# Patient Record
Sex: Male | Born: 1980
Health system: Southern US, Community
[De-identification: ages and names within clinical notes are randomized; demographics above are authoritative.]

## PROBLEM LIST (undated history)

## (undated) DIAGNOSIS — E785 Hyperlipidemia, unspecified: Secondary | ICD-10-CM

## (undated) DIAGNOSIS — B354 Tinea corporis: Secondary | ICD-10-CM

## (undated) DIAGNOSIS — I10 Essential (primary) hypertension: Secondary | ICD-10-CM

## (undated) DIAGNOSIS — R079 Chest pain, unspecified: Secondary | ICD-10-CM

## (undated) DIAGNOSIS — E1110 Type 2 diabetes mellitus with ketoacidosis without coma: Secondary | ICD-10-CM

## (undated) DIAGNOSIS — E139 Other specified diabetes mellitus without complications: Secondary | ICD-10-CM

## (undated) DIAGNOSIS — K529 Noninfective gastroenteritis and colitis, unspecified: Secondary | ICD-10-CM

## (undated) HISTORY — DX: Hyperlipidemia, unspecified: E78.5

## (undated) HISTORY — DX: Essential (primary) hypertension: I10

## (undated) HISTORY — DX: Noninfective gastroenteritis and colitis, unspecified: K52.9

## (undated) HISTORY — PX: KNEE SURGERY: SHX244

## (undated) HISTORY — DX: Chest pain, unspecified: R07.9

## (undated) HISTORY — DX: Other specified diabetes mellitus without complications: E13.9

## (undated) HISTORY — DX: Type 2 diabetes mellitus with ketoacidosis without coma: E11.10

## (undated) HISTORY — DX: Tinea corporis: B35.4

---
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
## 1999-04-16 ENCOUNTER — Encounter: Admission: RE | Admit: 1999-04-16 | Discharge: 1999-04-16 | Payer: Self-pay | Admitting: Family Medicine

## 2010-11-07 ENCOUNTER — Ambulatory Visit: Payer: Self-pay | Admitting: Internal Medicine

## 2013-06-18 DIAGNOSIS — E785 Hyperlipidemia, unspecified: Secondary | ICD-10-CM

## 2013-06-18 DIAGNOSIS — B354 Tinea corporis: Secondary | ICD-10-CM

## 2013-06-18 DIAGNOSIS — E119 Type 2 diabetes mellitus without complications: Secondary | ICD-10-CM | POA: Insufficient documentation

## 2013-06-18 DIAGNOSIS — I10 Essential (primary) hypertension: Secondary | ICD-10-CM

## 2013-06-18 DIAGNOSIS — E139 Other specified diabetes mellitus without complications: Secondary | ICD-10-CM

## 2013-06-18 HISTORY — DX: Essential (primary) hypertension: I10

## 2013-06-18 HISTORY — DX: Tinea corporis: B35.4

## 2013-06-18 HISTORY — DX: Other specified diabetes mellitus without complications: E13.9

## 2013-06-18 HISTORY — DX: Hyperlipidemia, unspecified: E78.5

## 2014-03-12 ENCOUNTER — Other Ambulatory Visit: Payer: Self-pay | Admitting: Pharmacotherapy

## 2015-10-31 DIAGNOSIS — K529 Noninfective gastroenteritis and colitis, unspecified: Secondary | ICD-10-CM | POA: Insufficient documentation

## 2015-10-31 DIAGNOSIS — E1110 Type 2 diabetes mellitus with ketoacidosis without coma: Secondary | ICD-10-CM | POA: Insufficient documentation

## 2015-10-31 HISTORY — DX: Type 2 diabetes mellitus with ketoacidosis without coma: E11.10

## 2015-10-31 HISTORY — DX: Noninfective gastroenteritis and colitis, unspecified: K52.9

## 2016-11-06 DIAGNOSIS — I10 Essential (primary) hypertension: Secondary | ICD-10-CM | POA: Diagnosis not present

## 2016-11-06 DIAGNOSIS — E1165 Type 2 diabetes mellitus with hyperglycemia: Secondary | ICD-10-CM | POA: Diagnosis not present

## 2016-11-06 DIAGNOSIS — Z4681 Encounter for fitting and adjustment of insulin pump: Secondary | ICD-10-CM | POA: Diagnosis not present

## 2016-12-17 DIAGNOSIS — E119 Type 2 diabetes mellitus without complications: Secondary | ICD-10-CM | POA: Diagnosis not present

## 2017-01-06 DIAGNOSIS — E784 Other hyperlipidemia: Secondary | ICD-10-CM | POA: Diagnosis not present

## 2017-01-06 DIAGNOSIS — I10 Essential (primary) hypertension: Secondary | ICD-10-CM | POA: Diagnosis not present

## 2017-01-06 DIAGNOSIS — M722 Plantar fascial fibromatosis: Secondary | ICD-10-CM | POA: Diagnosis not present

## 2017-01-06 DIAGNOSIS — E1165 Type 2 diabetes mellitus with hyperglycemia: Secondary | ICD-10-CM | POA: Diagnosis not present

## 2017-02-20 DIAGNOSIS — Z6839 Body mass index (BMI) 39.0-39.9, adult: Secondary | ICD-10-CM | POA: Diagnosis not present

## 2017-02-20 DIAGNOSIS — Z4681 Encounter for fitting and adjustment of insulin pump: Secondary | ICD-10-CM | POA: Diagnosis not present

## 2017-02-20 DIAGNOSIS — I10 Essential (primary) hypertension: Secondary | ICD-10-CM | POA: Diagnosis not present

## 2017-02-20 DIAGNOSIS — E1165 Type 2 diabetes mellitus with hyperglycemia: Secondary | ICD-10-CM | POA: Diagnosis not present

## 2017-03-18 DIAGNOSIS — E119 Type 2 diabetes mellitus without complications: Secondary | ICD-10-CM | POA: Diagnosis not present

## 2017-03-21 DIAGNOSIS — Z23 Encounter for immunization: Secondary | ICD-10-CM | POA: Diagnosis not present

## 2017-03-21 DIAGNOSIS — R590 Localized enlarged lymph nodes: Secondary | ICD-10-CM | POA: Diagnosis not present

## 2017-03-21 DIAGNOSIS — Z0001 Encounter for general adult medical examination with abnormal findings: Secondary | ICD-10-CM | POA: Diagnosis not present

## 2017-03-21 DIAGNOSIS — Z1389 Encounter for screening for other disorder: Secondary | ICD-10-CM | POA: Diagnosis not present

## 2017-03-21 DIAGNOSIS — Z6838 Body mass index (BMI) 38.0-38.9, adult: Secondary | ICD-10-CM | POA: Diagnosis not present

## 2017-03-21 DIAGNOSIS — E119 Type 2 diabetes mellitus without complications: Secondary | ICD-10-CM | POA: Diagnosis not present

## 2017-03-26 DIAGNOSIS — I10 Essential (primary) hypertension: Secondary | ICD-10-CM | POA: Diagnosis not present

## 2017-04-28 ENCOUNTER — Other Ambulatory Visit: Payer: Self-pay | Admitting: Family Medicine

## 2017-05-13 DIAGNOSIS — E119 Type 2 diabetes mellitus without complications: Secondary | ICD-10-CM | POA: Diagnosis not present

## 2017-05-13 DIAGNOSIS — Z1389 Encounter for screening for other disorder: Secondary | ICD-10-CM | POA: Diagnosis not present

## 2017-05-13 DIAGNOSIS — E784 Other hyperlipidemia: Secondary | ICD-10-CM | POA: Diagnosis not present

## 2017-05-13 DIAGNOSIS — I10 Essential (primary) hypertension: Secondary | ICD-10-CM | POA: Diagnosis not present

## 2017-05-13 DIAGNOSIS — M722 Plantar fascial fibromatosis: Secondary | ICD-10-CM | POA: Diagnosis not present

## 2017-05-23 ENCOUNTER — Other Ambulatory Visit: Payer: Self-pay | Admitting: Family Medicine

## 2017-05-23 DIAGNOSIS — R1909 Other intra-abdominal and pelvic swelling, mass and lump: Secondary | ICD-10-CM

## 2017-05-28 ENCOUNTER — Ambulatory Visit
Admission: RE | Admit: 2017-05-28 | Discharge: 2017-05-28 | Disposition: A | Discharge status: Home / Self Care | Payer: BLUE CROSS/BLUE SHIELD | Source: Ambulatory Visit | Attending: Family Medicine | Admitting: Family Medicine

## 2017-05-28 ENCOUNTER — Other Ambulatory Visit: Payer: Self-pay

## 2017-05-28 DIAGNOSIS — R1909 Other intra-abdominal and pelvic swelling, mass and lump: Secondary | ICD-10-CM | POA: Diagnosis not present

## 2017-05-28 IMAGING — US US PELVIS LIMITED
1 series · 14 of 17 positions shown · non-contrast
Comparison: None.

CLINICAL DATA: Right inguinal mass for 2 months.  No known injury.

EXAM:
LIMITED ULTRASOUND OF PELVIS
TECHNIQUE: Limited transabdominal ultrasound examination of the pelvis was
performed.

[Series 1: us pelvis limited · 0.05mm/px · 17 acquisitions, 14 frames shown]
[im 1/17]
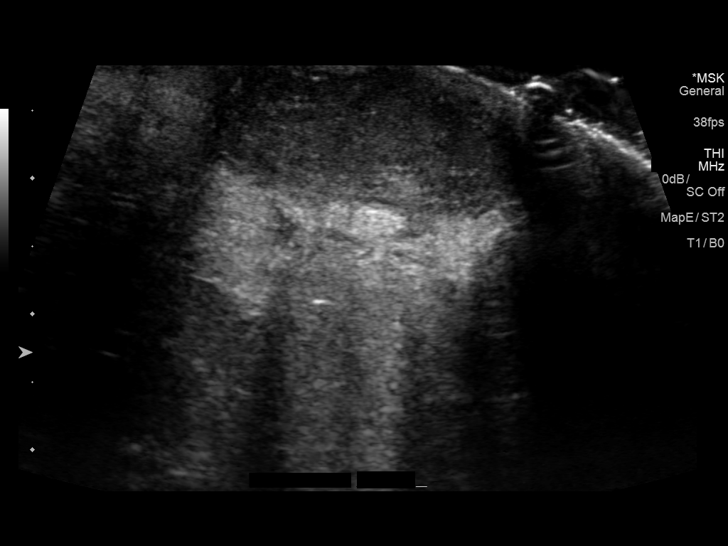
[im 2/17]
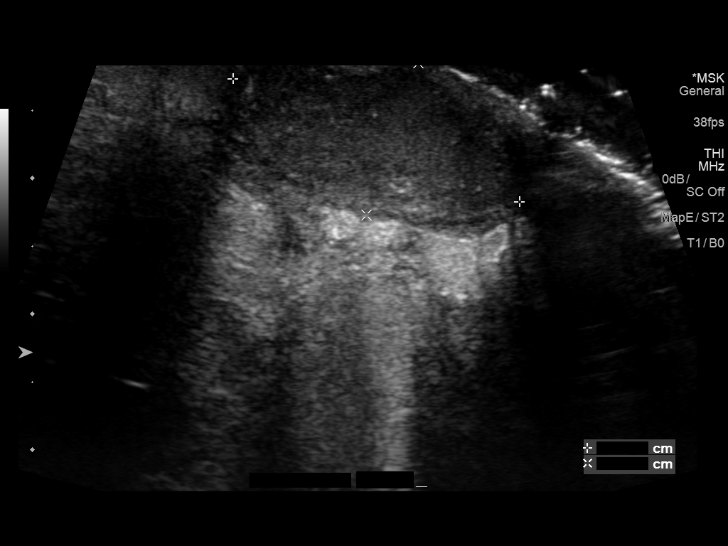
[im 4/17]
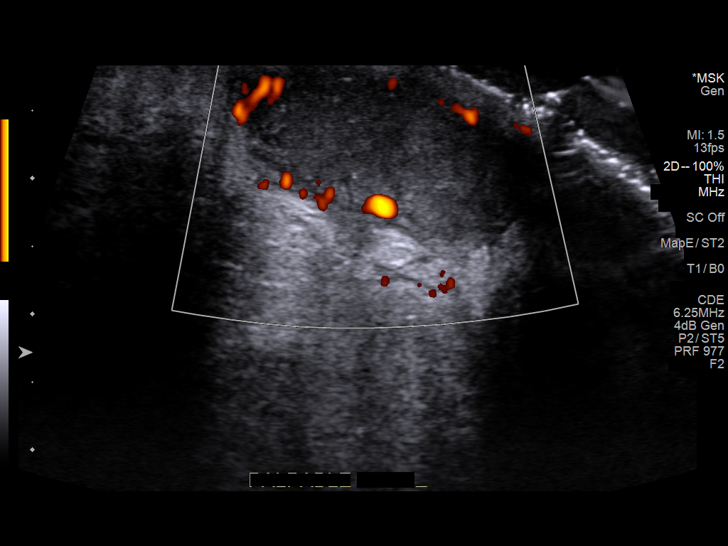
[im 5/17]
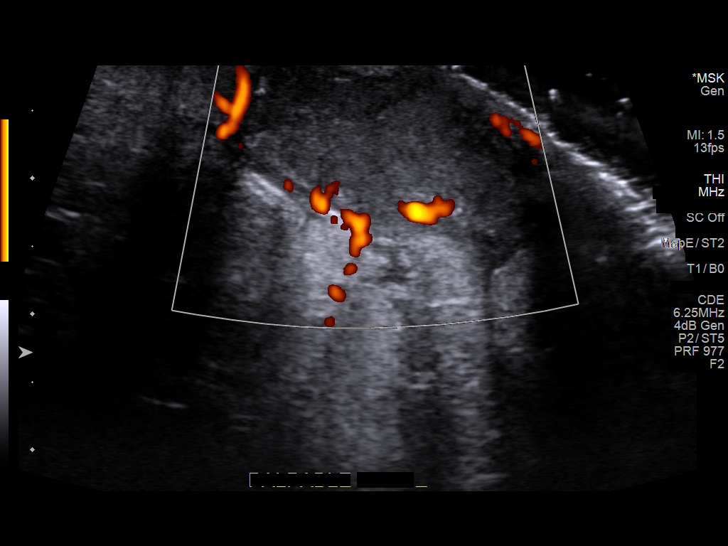
[im 6/17]
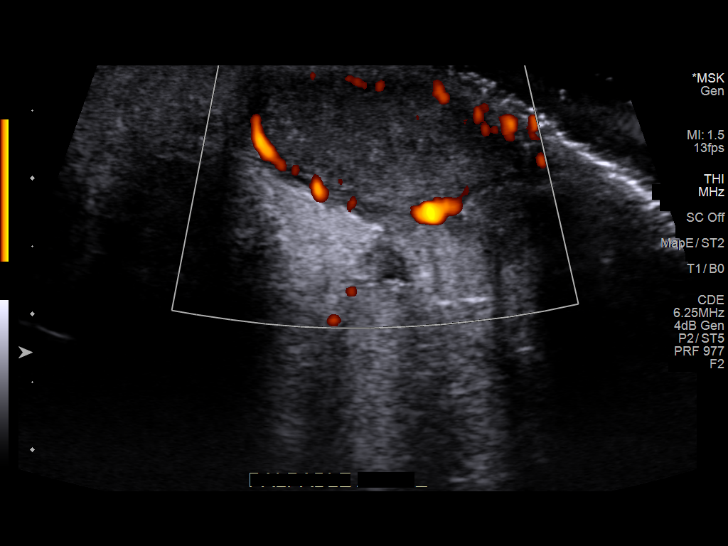
[im 7/17]
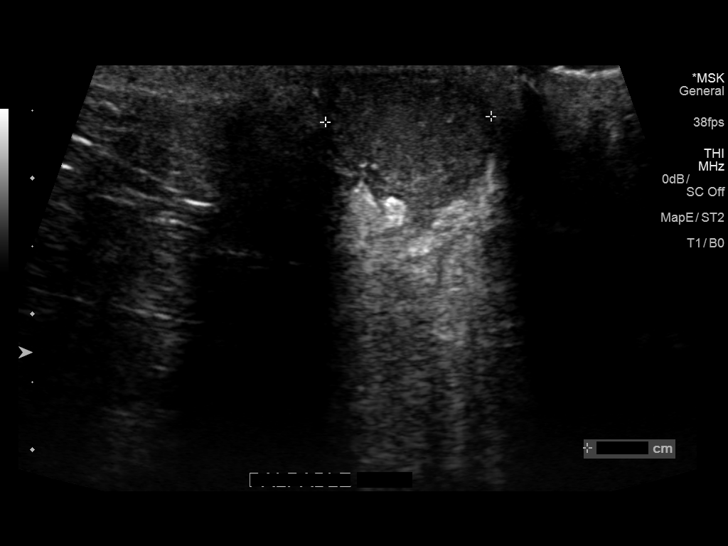
[im 8/17]
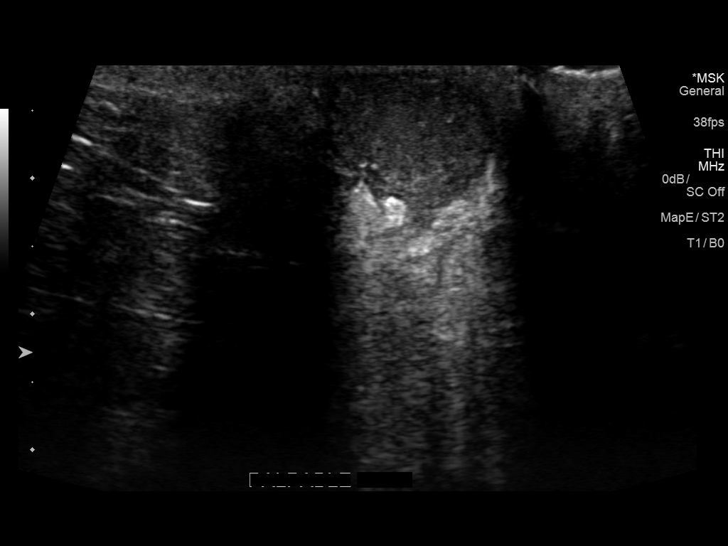
[im 10/17]
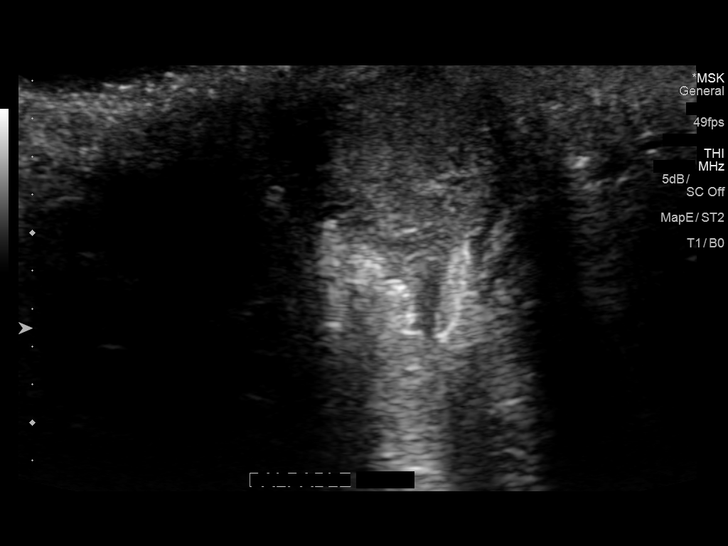
[im 11/17]
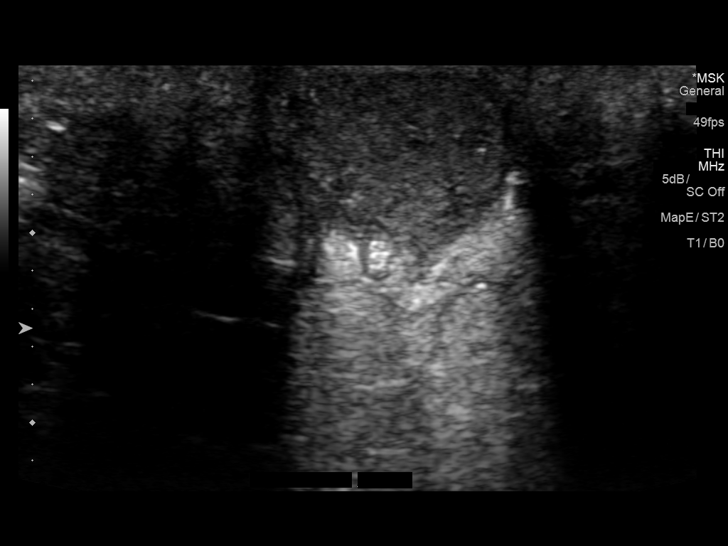
[im 12/17]
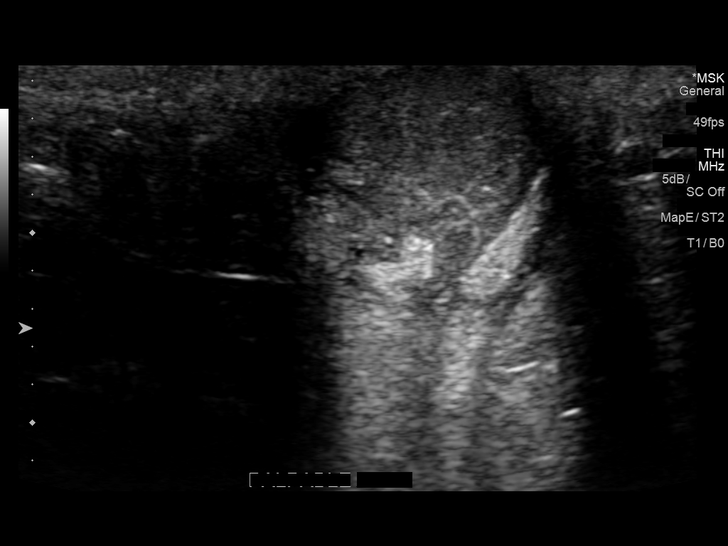
[im 13/17]
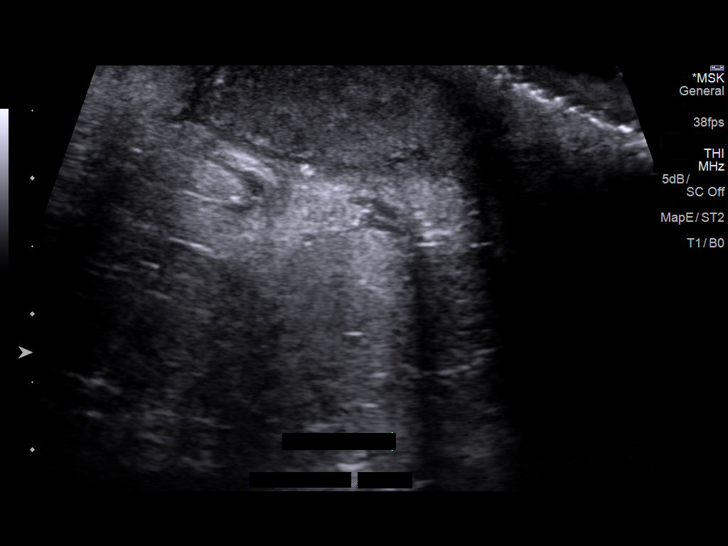
[im 14/17]
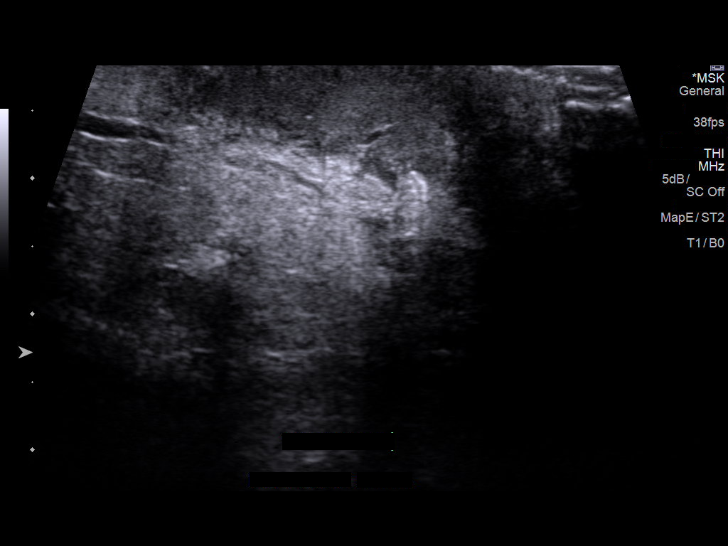
[im 16/17]
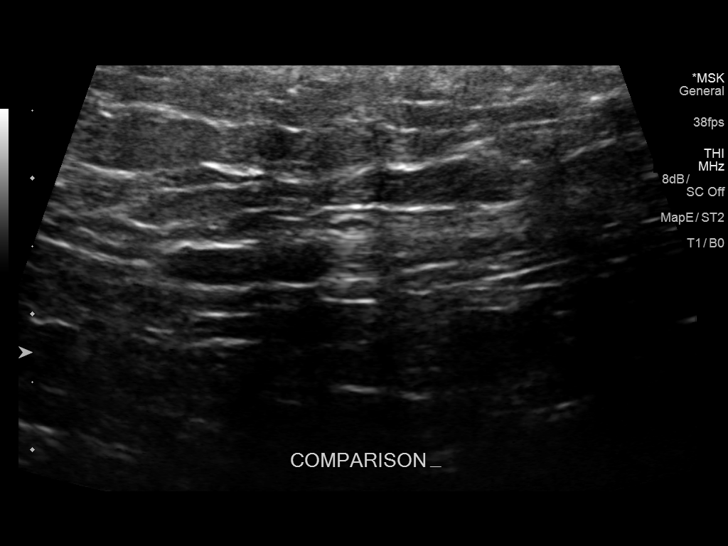
[im 17/17]
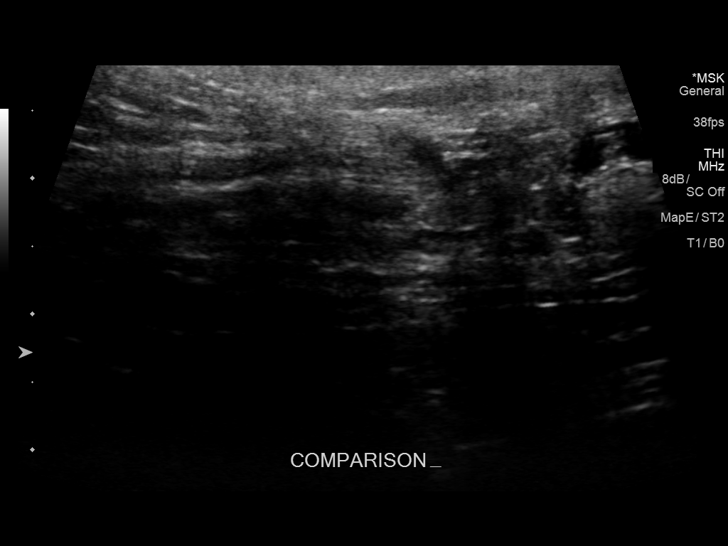

[14 of 17 positions shown; findings below may reference images not displayed]

FINDINGS: Scanning was directed toward the region of concern. A lesion in the
subcutaneous tissues measuring 2.3 x 1.2 x 1.2 cm is identified. The
lesion is hypoechoic with increase through transmission. Doppler
imaging demonstrates blood flow about the periphery of the image.
IMPRESSION: Nonspecific hypoechoic lesion in the region of concern may be and
myxoid or fibrous tumor. MRI with and without contrast is
recommended for further evaluation.

## 2017-06-02 ENCOUNTER — Other Ambulatory Visit: Payer: Self-pay | Admitting: Family Medicine

## 2017-06-02 DIAGNOSIS — R9389 Abnormal findings on diagnostic imaging of other specified body structures: Secondary | ICD-10-CM

## 2017-06-16 ENCOUNTER — Ambulatory Visit
Admission: RE | Admit: 2017-06-16 | Discharge: 2017-06-16 | Disposition: A | Discharge status: Home / Self Care | Payer: BLUE CROSS/BLUE SHIELD | Source: Ambulatory Visit | Attending: Family Medicine | Admitting: Family Medicine

## 2017-06-16 DIAGNOSIS — R9389 Abnormal findings on diagnostic imaging of other specified body structures: Secondary | ICD-10-CM

## 2017-06-16 DIAGNOSIS — K4090 Unilateral inguinal hernia, without obstruction or gangrene, not specified as recurrent: Secondary | ICD-10-CM | POA: Diagnosis not present

## 2017-06-16 IMAGING — MR MR PELVIS WO/W CM
11 series · 48 of 48 positions shown · IV contrast (Multihance 20 ML)
Comparison: Soft tissue ultrasound dated 03/28/2017

CLINICAL DATA: Palpable right groin abnormality, resolved 2 weeks
ago

EXAM:
MRI PELVIS WITHOUT AND WITH CONTRAST
TECHNIQUE: Multiplanar multisequence MR imaging of the pelvis was performed
both before and after administration of intravenous contrast.
CONTRAST:  20 mL Multihance IV
Creatinine was obtained on site at [HOSPITAL] at [HOSPITAL].
Results: Creatinine 0.8 mg/dL.

[Series 12: T2 · coronal · 5.0mm · 1.00mm/px · 4 of 36 slices shown (1 of 2)]
[im 1/36]
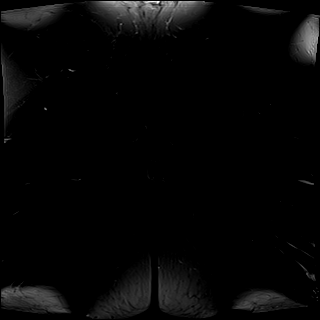
[im 12/36]
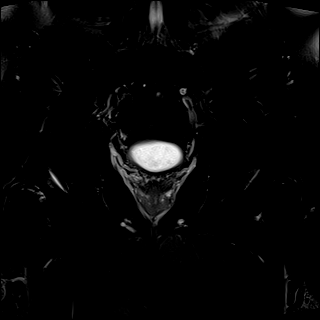
[im 24/36]
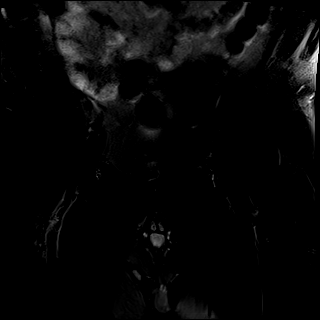
[im 36/36]
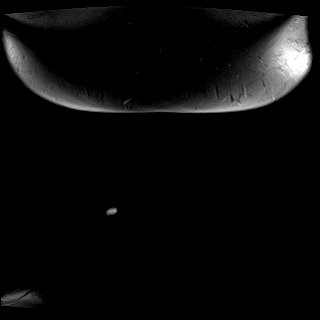

[Series 13: T2 · sagittal · 4.0mm · 1.00mm/px · 4 of 39 slices shown (2 of 2)]
[im 1/39]
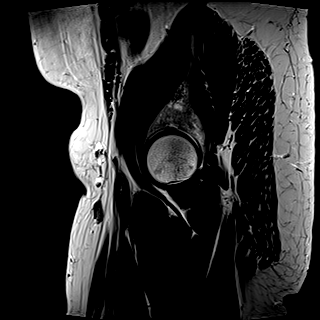
[im 13/39]
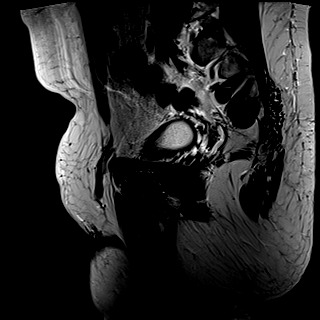
[im 26/39]
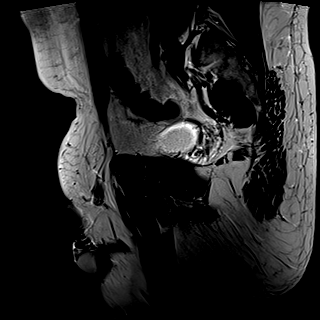
[im 39/39]
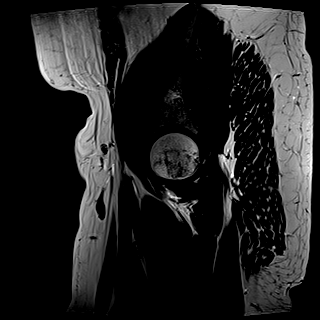

[Series 14: T2 fat-sat · coronal · 5.0mm · 1.00mm/px · 4 of 36 slices shown (1 of 2)]
[im 1/36]
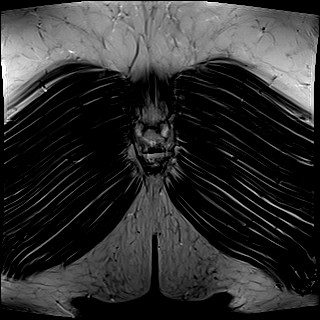
[im 12/36]
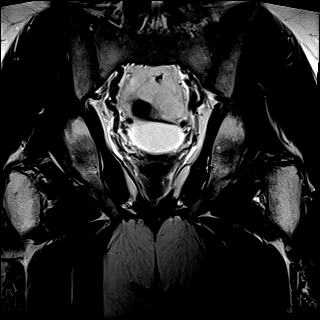
[im 24/36]
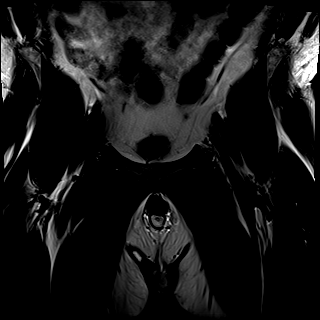
[im 36/36]
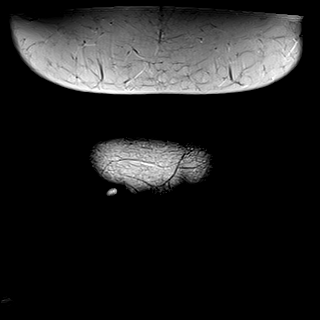

[Series 15: T2 fat-sat · axial · 4.0mm · 0.94mm/px · z∈[-88,+105]mm · 5 of 44 slices shown (2 of 2)]
[im 1/44]
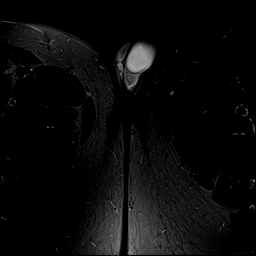
[im 11/44]
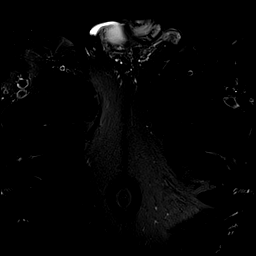
[im 22/44]
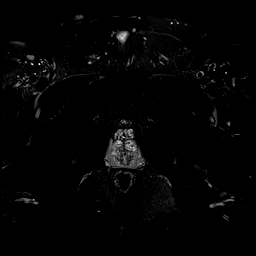
[im 33/44]
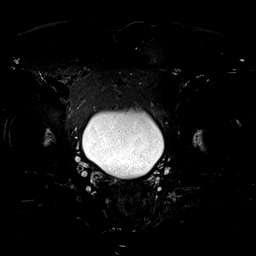
[im 44/44]
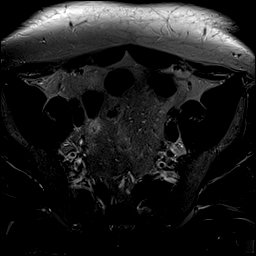

[Series 16: T1 · axial · 4.0mm · 0.94mm/px · z∈[-88,+105]mm · 5 of 44 slices shown (1 of 3)]
[im 1/44]
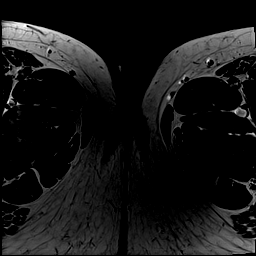
[im 11/44]
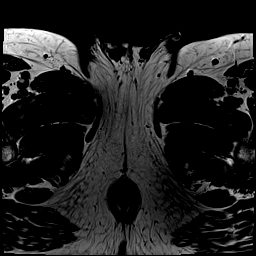
[im 22/44]
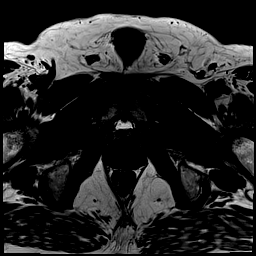
[im 33/44]
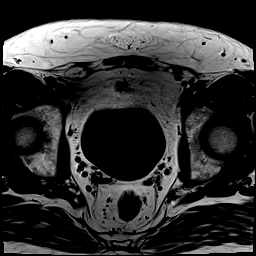
[im 44/44]
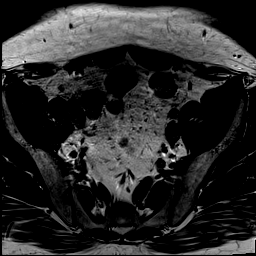

[Series 17: T1 fat-sat · axial · non-contrast · 4.0mm · 0.94mm/px · z∈[-88,+105]mm · 5 of 44 slices shown (1 of 4)]
[im 1/44]
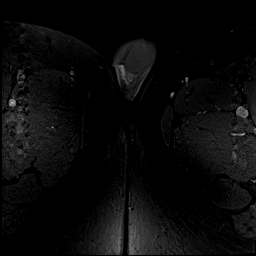
[im 11/44]
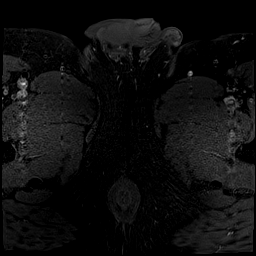
[im 22/44]
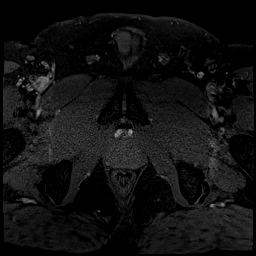
[im 33/44]
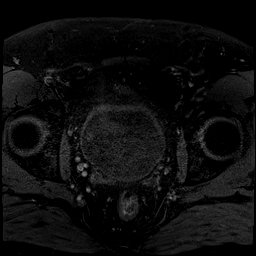
[im 44/44]
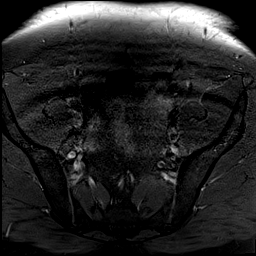

[Series 19: T1 · coronal · 5.0mm · 1.00mm/px · 4 of 36 slices shown (2 of 3)]
[im 1/36]
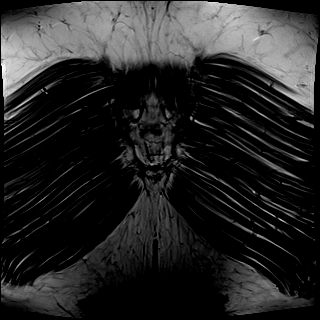
[im 12/36]
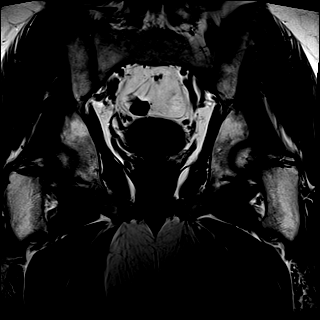
[im 24/36]
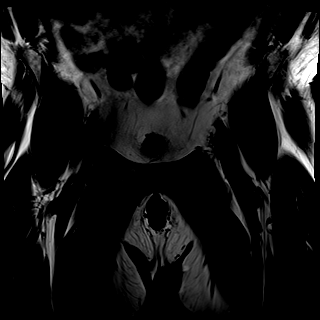
[im 36/36]
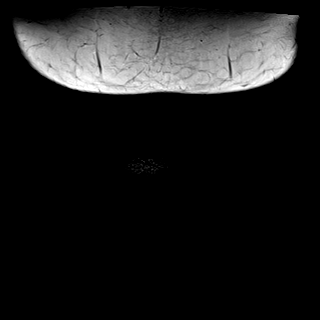

[Series 20: T1 · sagittal · 4.0mm · 1.00mm/px · 4 of 39 slices shown (3 of 3)]
[im 1/39]
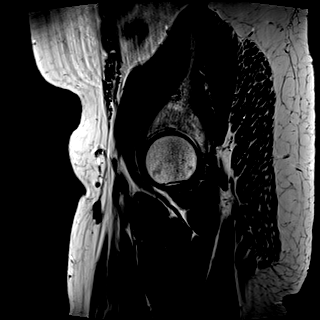
[im 13/39]
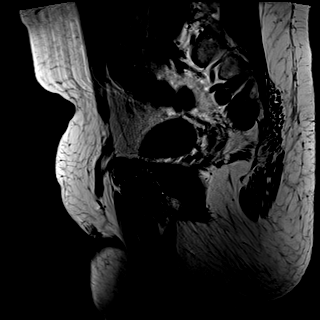
[im 26/39]
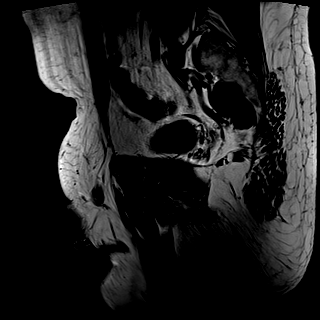
[im 39/39]
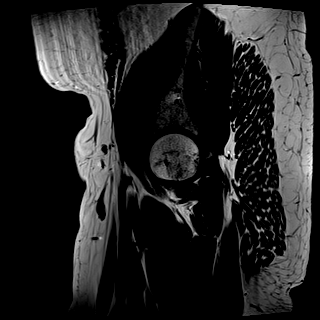

[Series 21: T1 fat-sat · axial · non-contrast · 4.0mm · 0.94mm/px · z∈[-88,+105]mm · 5 of 44 slices shown (2 of 4)]
[im 1/44]
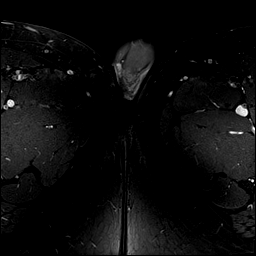
[im 11/44]
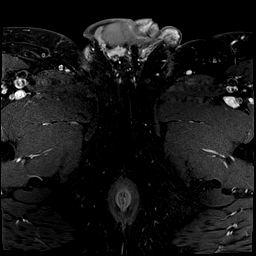
[im 22/44]
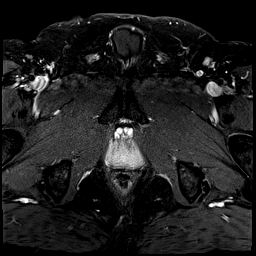
[im 33/44]
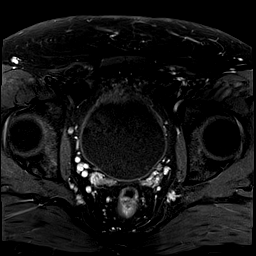
[im 44/44]
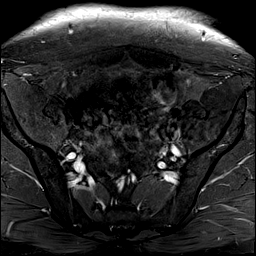

[Series 22: T1 fat-sat · coronal · 5.0mm · 1.00mm/px · 4 of 36 slices shown (3 of 4)]
[im 1/36]
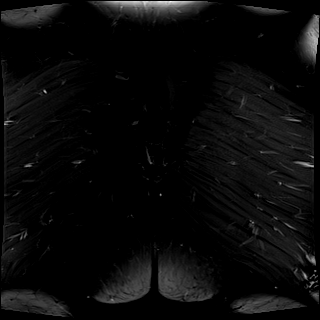
[im 12/36]
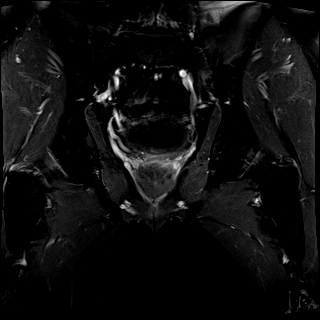
[im 24/36]
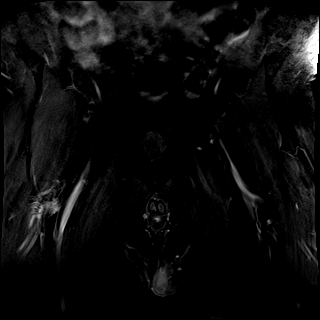
[im 36/36]
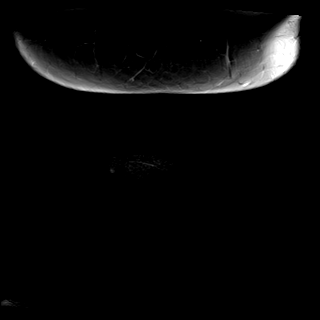

[Series 23: T1 fat-sat · sagittal · 4.0mm · 1.00mm/px · 4 of 39 slices shown (4 of 4)]
[im 1/39]
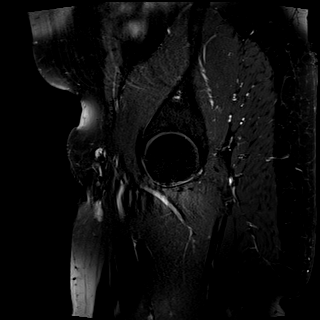
[im 13/39]
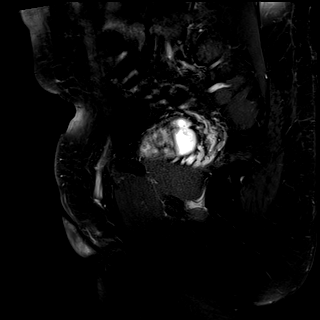
[im 26/39]
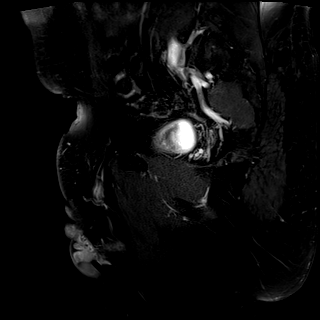
[im 39/39]
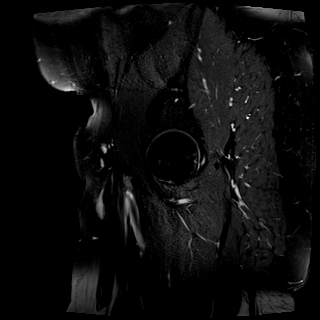

[48 of 48 positions shown; findings below may reference images not displayed]

FINDINGS: Urinary Tract:  Bladder is within normal limits.

Bowel:  Visualized bowel is unremarkable.

Vascular/Lymphatic: No suspicious pelvic lymphadenopathy.

Small bilateral inguinal nodes measuring up to 9 mm short axis,
within normal limits.

Reproductive:  Prostate is within normal limits.

Other: No soft tissue mass, fluid collection, or abscess in the
right groin/inguinal region to correspond to the palpable
abnormality which has clinically resolved.

Musculoskeletal: Tiny fat containing right inguinal hernia (series
20/ image 12).

No focal osseous lesions.
IMPRESSION: No soft tissue mass, fluid collection, or abscess in the right
groin/inguinal region.

Tiny fat containing right inguinal hernia.

Small bilateral inguinal nodes, reactive.

## 2017-06-25 DIAGNOSIS — Z4681 Encounter for fitting and adjustment of insulin pump: Secondary | ICD-10-CM | POA: Diagnosis not present

## 2017-06-25 DIAGNOSIS — I10 Essential (primary) hypertension: Secondary | ICD-10-CM | POA: Diagnosis not present

## 2017-06-25 DIAGNOSIS — Z6837 Body mass index (BMI) 37.0-37.9, adult: Secondary | ICD-10-CM | POA: Diagnosis not present

## 2017-06-25 DIAGNOSIS — E1165 Type 2 diabetes mellitus with hyperglycemia: Secondary | ICD-10-CM | POA: Diagnosis not present

## 2017-08-04 DIAGNOSIS — E119 Type 2 diabetes mellitus without complications: Secondary | ICD-10-CM | POA: Diagnosis not present

## 2017-09-10 DIAGNOSIS — I10 Essential (primary) hypertension: Secondary | ICD-10-CM | POA: Diagnosis not present

## 2017-09-10 DIAGNOSIS — E119 Type 2 diabetes mellitus without complications: Secondary | ICD-10-CM | POA: Diagnosis not present

## 2017-09-10 DIAGNOSIS — Z23 Encounter for immunization: Secondary | ICD-10-CM | POA: Diagnosis not present

## 2017-10-13 DIAGNOSIS — J0140 Acute pansinusitis, unspecified: Secondary | ICD-10-CM | POA: Diagnosis not present

## 2017-10-22 DIAGNOSIS — I10 Essential (primary) hypertension: Secondary | ICD-10-CM | POA: Diagnosis not present

## 2017-10-22 DIAGNOSIS — Z794 Long term (current) use of insulin: Secondary | ICD-10-CM | POA: Diagnosis not present

## 2017-10-22 DIAGNOSIS — Z4681 Encounter for fitting and adjustment of insulin pump: Secondary | ICD-10-CM | POA: Diagnosis not present

## 2017-10-22 DIAGNOSIS — E1165 Type 2 diabetes mellitus with hyperglycemia: Secondary | ICD-10-CM | POA: Diagnosis not present

## 2017-10-30 DIAGNOSIS — E119 Type 2 diabetes mellitus without complications: Secondary | ICD-10-CM | POA: Diagnosis not present

## 2017-12-23 DIAGNOSIS — J101 Influenza due to other identified influenza virus with other respiratory manifestations: Secondary | ICD-10-CM | POA: Diagnosis not present

## 2018-01-21 DIAGNOSIS — R0789 Other chest pain: Secondary | ICD-10-CM | POA: Diagnosis not present

## 2018-01-28 DIAGNOSIS — K219 Gastro-esophageal reflux disease without esophagitis: Secondary | ICD-10-CM | POA: Diagnosis not present

## 2018-01-28 DIAGNOSIS — Z6837 Body mass index (BMI) 37.0-37.9, adult: Secondary | ICD-10-CM | POA: Diagnosis not present

## 2018-01-28 DIAGNOSIS — Z1389 Encounter for screening for other disorder: Secondary | ICD-10-CM | POA: Diagnosis not present

## 2018-01-28 DIAGNOSIS — R0789 Other chest pain: Secondary | ICD-10-CM | POA: Diagnosis not present

## 2018-01-28 DIAGNOSIS — R140 Abdominal distension (gaseous): Secondary | ICD-10-CM | POA: Diagnosis not present

## 2018-02-02 ENCOUNTER — Telehealth: Payer: Self-pay

## 2018-02-02 NOTE — Telephone Encounter (Signed)
Sent referral to scheduling, attached notes to March file.   

## 2018-02-04 DIAGNOSIS — R079 Chest pain, unspecified: Secondary | ICD-10-CM | POA: Insufficient documentation

## 2018-02-04 HISTORY — DX: Chest pain, unspecified: R07.9

## 2018-02-09 ENCOUNTER — Telehealth: Payer: Self-pay

## 2018-02-09 NOTE — Telephone Encounter (Signed)
FAXED NOTES TO [facility] 

## 2018-02-09 NOTE — Progress Notes (Signed)
Cardiology Office Note:    Date:  02/10/2018   ID:  Keith Bautista, DOB 10-13-1981, MRN [id number]  PCP:  Assunta Found, MD  Cardiologist:  Norman Herrlich, MD   Referring MD: Assunta Found, MD  ASSESSMENT:    1. Chest pain, unspecified type   2. Hyperlipidemia, unspecified hyperlipidemia type   3. Essential hypertension   4. Type 2 diabetes mellitus without complication, with long-term current use of insulin (HCC)    PLAN:    In order of problems listed above:  1. His chest pain is atypical but concerning fortunately his EKG remains normal.  I will draw a troponin today if abnormal he will need to be evaluated as an inpatient acute coronary syndrome if normal cardiac CTA will be performed.  He will continue his statin and I asked him to take a low-dose aspirin.  If his cardiac evaluation is normal he would benefit from endoscopy with the severity of his symptoms. 2. Stable continue statin await labs from his PCP 3. Stable blood pressure target continue current treatment 4. Stable managed by endocrinology  Next appointment   Medication Adjustments/Labs and Tests Ordered: Current medicines are reviewed at length with the patient today.  Concerns regarding medicines are outlined above.  Orders Placed This Encounter  Procedures  . CT CORONARY MORPH W/CTA COR W/SCORE W/CA W/CM &/OR WO/CM  . CT CORONARY FRACTIONAL FLOW RESERVE DATA PREP  . CT CORONARY FRACTIONAL FLOW RESERVE FLUID ANALYSIS  . Troponin I  . EKG 12-Lead   Meds ordered this encounter  Medications  . metoprolol tartrate (LOPRESSOR) 50 MG tablet    Sig: Take 1 tablet (50 mg total) by mouth once for 1 dose. Take 1 tablet 1 hour before cardiac CTA.    Dispense:  1 tablet    Refill:  0     Chief Complaint  Patient presents with  . New Patient (Initial Visit)    to evaluate CP x 1 month     History of Present Illness:    Keith Bautista is a 37 y.o. male with T2 DM hypertension and hyperlipidemia who is  being seen today for the evaluation of chest pain at the request of Assunta Found, MD. He developed diabetes about age 48 and uses an insulin pump.  For the last 4-6 weeks he has been aware of discomfort in his chest it is for all purposes constant is relatively mild it is always there and it causes him to become apprehensive he is pushed himself physically it does not change the symptoms and when he rests he does not have relief.  He also complains bitterly of GI upset with a sensation of fullness belching that gives him transient relief but no dysphagia and ongoing diarrhea.  He has no known history of heart disease.  Chest discomfort is not severe and is not pleuritic sign associated with shortness of breath unrelated to his meals.  Past Medical History:  Diagnosis Date  . Chest pain 02/04/2018  . 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 DKA (diabetic ketoacidoses) (HCC) 10/31/2015  . Gastroenteritis, acute 10/31/2015  . Hyperlipidemia 06/18/2013  . Hypertension 06/18/2013  . Tinea corporis 06/18/2013    Past Surgical History:  Procedure Laterality Date  . KNEE SURGERY Right     Current Medications: Current Meds  Medication Sig  . atorvastatin (LIPITOR) 20 MG tablet Take 20 mg by mouth daily.  . dapagliflozin propanediol (FARXIGA) 10 MG TABS tablet TAKE 1 TABLET ONCE  DAILY  . insulin lispro (HUMALOG) 100 UNIT/ML injection Inject into the skin. Insulin pump/sliding scale  . losartan (COZAAR) 100 MG tablet Take 100 mg by mouth daily.  . pantoprazole (PROTONIX) 40 MG tablet Take 40 mg by mouth daily as needed.  . Simethicone (GAS-X PO) Take 125 mg by mouth daily.     Allergies:   Simvastatin   Social History   Socioeconomic History  . Marital status: Married    Spouse name: Not on file  . Number of children: Not on file  . Years of education: Not on file  . Highest education level: Not on file  Occupational History  . Not on file  Social Needs  . Financial resource  strain: Not on file  . Food insecurity:    Worry: Not on file    Inability: Not on file  . Transportation needs:    Medical: Not on file    Non-medical: Not on file  Tobacco Use  . Smoking status: Never Smoker  . Smokeless tobacco: Former Neurosurgeon    Types: Chew, Snuff  Substance and Sexual Activity  . Alcohol use: Not Currently  . Drug use: Never  . Sexual activity: Not on file  Lifestyle  . Physical activity:    Days per week: Not on file    Minutes per session: Not on file  . Stress: Not on file  Relationships  . Social connections:    Talks on phone: Not on file    Gets together: Not on file    Attends religious service: Not on file    Active member of club or organization: Not on file    Attends meetings of clubs or organizations: Not on file    Relationship status: Not on file  Other Topics Concern  . Not on file  Social History Narrative  . Not on file     Family History: The patient's family history includes Atrial fibrillation in his father; CAD in his maternal grandfather; Congestive Heart Failure in his mother; Heart attack in his maternal grandfather; Hypertension in his brother; Prostate cancer in his paternal grandfather; Rheum arthritis in his father.  ROS:   Review of Systems  Constitution: Negative.  HENT: Negative.   Eyes: Negative.   Cardiovascular: Positive for chest pain.  Respiratory: Negative.   Endocrine: Negative.   Hematologic/Lymphatic: Negative.   Skin: Negative.   Musculoskeletal: Negative.   Gastrointestinal: Positive for diarrhea and flatus (and belching).  Genitourinary: Negative.   Neurological: Negative.   Psychiatric/Behavioral: Negative.   Allergic/Immunologic: Negative.    Please see the history of present illness.     All other systems reviewed and are negative.  EKGs/Labs/Other Studies Reviewed:    The following studies were reviewed today:   EKG:  EKG is  ordered today.  The ekg ordered today demonstrates SRTH  normal  Recent Labs: No results found for requested labs within last 8760 hours.  Recent Lipid Panel No results found for: CHOL, TRIG, HDL, CHOLHDL, VLDL, LDLCALC, LDLDIRECT  Physical Exam:    VS:  BP 134/86 (BP Location: Left Arm, Patient Position: Sitting, Cuff Size: Normal)   Pulse 73   Ht 6\' 2"  (1.88 m)   Wt 283 lb 12.8 oz (128.7 kg)   SpO2 98%   BMI 36.44 kg/m     Wt Readings from Last 3 Encounters:  02/10/18 283 lb 12.8 oz (128.7 kg)     GEN:  Well nourished, well developed in no acute distress HEENT: Normal NECK:  No JVD; No carotid bruits LYMPHATICS: No lymphadenopathy CARDIAC: RRR, no murmurs, rubs, gallops RESPIRATORY:  Clear to auscultation without rales, wheezing or rhonchi  ABDOMEN: Soft, non-tender, non-distended MUSCULOSKELETAL:  No edema; No deformity  SKIN: Warm and dry NEUROLOGIC:  Alert and oriented x 3 PSYCHIATRIC:  Normal affect     Signed, Norman HerrlichBrian Munley, MD  02/10/2018 2:51 PM    [facility]

## 2018-02-10 ENCOUNTER — Encounter: Payer: Self-pay | Admitting: Cardiology

## 2018-02-10 ENCOUNTER — Ambulatory Visit (INDEPENDENT_AMBULATORY_CARE_PROVIDER_SITE_OTHER): Payer: BLUE CROSS/BLUE SHIELD | Admitting: Cardiology

## 2018-02-10 VITALS — BP 134/86 | HR 73 | Ht 74.0 in | Wt 283.8 lb

## 2018-02-10 DIAGNOSIS — E109 Type 1 diabetes mellitus without complications: Secondary | ICD-10-CM | POA: Diagnosis not present

## 2018-02-10 DIAGNOSIS — E785 Hyperlipidemia, unspecified: Secondary | ICD-10-CM | POA: Diagnosis not present

## 2018-02-10 DIAGNOSIS — I10 Essential (primary) hypertension: Secondary | ICD-10-CM

## 2018-02-10 DIAGNOSIS — R079 Chest pain, unspecified: Secondary | ICD-10-CM | POA: Diagnosis not present

## 2018-02-10 MED ORDER — METOPROLOL TARTRATE 50 MG PO TABS: 50.0000 mg | ORAL_TABLET | Freq: Once | ORAL | 0 refills | Status: DC

## 2018-02-10 NOTE — Patient Instructions (Addendum)
Medication Instructions:  Your physician has recommended you make the following change in your medication:  START enteric coated aspirin 81 mg daily  Labwork: Your physician recommends that you have the following labs drawn: Troponin.  Testing/Procedures: Your physician has requested that you have cardiac CT. Cardiac computed tomography (CT) is a painless test that uses an x-ray machine to take clear, detailed pictures of your heart. For further information please visit [email or web address]. Please follow instruction sheet as given.  Please arrive at the [facility] main entrance of [facility] at xx:xx AM (30-45 minutes prior to test start time)  [facility] [address] [place], [place] [address] [phone]  Proceed to the [facility] Radiology Department (First Floor).  Please follow these instructions carefully (unless otherwise directed):  Hold all erectile dysfunction medications at least 48 hours prior to test.  On the Night Before the Test: . Drink plenty of water. . Do not consume any caffeinated/decaffeinated beverages or chocolate 12 hours prior to your test. . Do not take any antihistamines 12 hours prior to your test.  On the Day of the Test: . Drink plenty of water. Do not drink any water within one hour of the test. . Do not eat any food 4 hours prior to the test. . You may take your regular medications prior to the test. . IF NOT ON A BETA BLOCKER - Take 50 mg of lopressor (metoprolol) one hour before the test. . HOLD Furosemide morning of the test.  After the Test: . Drink plenty of water. . After receiving IV contrast, you may experience a mild flushed feeling. This is normal. . On occasion, you may experience a mild rash up to 24 hours after the test. This is not dangerous. If this occurs, you can take Benadryl 25 mg and increase your fluid intake. . If you experience trouble breathing, this can be serious. If it is severe call 911  IMMEDIATELY. If it is mild, please call our office.  Follow-Up: Your physician recommends that you schedule a follow-up appointment in: 3 weeks.  Any Other Special Instructions Will Be Listed Below (If Applicable).     If you need a refill on your cardiac medications before your next appointment, please call your pharmacy.

## 2018-02-11 LAB — TROPONIN I: Troponin I: <0.01 ng/mL (ref 0.00–0.04)

## 2018-02-18 DIAGNOSIS — E1165 Type 2 diabetes mellitus with hyperglycemia: Secondary | ICD-10-CM | POA: Diagnosis not present

## 2018-02-18 DIAGNOSIS — I10 Essential (primary) hypertension: Secondary | ICD-10-CM | POA: Diagnosis not present

## 2018-02-18 DIAGNOSIS — Z794 Long term (current) use of insulin: Secondary | ICD-10-CM | POA: Diagnosis not present

## 2018-02-18 DIAGNOSIS — Z4681 Encounter for fitting and adjustment of insulin pump: Secondary | ICD-10-CM | POA: Diagnosis not present

## 2018-02-19 DIAGNOSIS — E119 Type 2 diabetes mellitus without complications: Secondary | ICD-10-CM | POA: Diagnosis not present

## 2018-02-24 ENCOUNTER — Telehealth: Payer: Self-pay | Admitting: Cardiology

## 2018-02-24 NOTE — Telephone Encounter (Signed)
Patient is calling back because he ha not heard about scheduling his Cardiac CT???

## 2018-02-24 NOTE — Telephone Encounter (Signed)
Patient advised that a message has been sent to the scheduler again to follow-up and that he should hear something in the next few days. Patient verbalized understanding. No further questions.

## 2018-03-02 ENCOUNTER — Telehealth: Payer: Self-pay | Admitting: Cardiology

## 2018-03-02 NOTE — Progress Notes (Deleted)
Cardiology Office Note:    Date:  03/02/2018   ID:  Keith Bautista, DOB 09-05-1981, MRN [id number]  PCP:  Assunta FoundGolding, John, MD  Cardiologist:  Norman HerrlichBrian Zailyn Thoennes, MD    Referring MD: Assunta FoundGolding, John, MD    ASSESSMENT:    No diagnosis found. PLAN:    In order of problems listed above:  1. ***   Next appointment: ***   Medication Adjustments/Labs and Tests Ordered: Current medicines are reviewed at length with the patient today.  Concerns regarding medicines are outlined above.  No orders of the defined types were placed in this encounter.  No orders of the defined types were placed in this encounter.   No chief complaint on file.   History of Present Illness:    Keith Bautista is a 37 y.o. male with a hx of T2 DM hypertension and hyperlipidemia  last seen 02/10/18 for evaluation of chest pain. Compliance with diet, lifestyle and medications: *** Past Medical History:  Diagnosis Date  . Chest pain 02/04/2018  . 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 DKA (diabetic ketoacidoses) (HCC) 10/31/2015  . Gastroenteritis, acute 10/31/2015  . Hyperlipidemia 06/18/2013  . Hypertension 06/18/2013  . Tinea corporis 06/18/2013    Past Surgical History:  Procedure Laterality Date  . KNEE SURGERY Right     Current Medications: No outpatient medications have been marked as taking for the 03/03/18 encounter (Appointment) with Baldo DaubMunley, Senya Hinzman J, MD.     Allergies:   Simvastatin   Social History   Socioeconomic History  . Marital status: Married    Spouse name: Not on file  . Number of children: Not on file  . Years of education: Not on file  . Highest education level: Not on file  Occupational History  . Not on file  Social Needs  . Financial resource strain: Not on file  . Food insecurity:    Worry: Not on file    Inability: Not on file  . Transportation needs:    Medical: Not on file    Non-medical: Not on file  Tobacco Use  . Smoking status: Never Smoker  .  Smokeless tobacco: Former NeurosurgeonUser    Types: Chew, Snuff  Substance and Sexual Activity  . Alcohol use: Not Currently  . Drug use: Never  . Sexual activity: Not on file  Lifestyle  . Physical activity:    Days per week: Not on file    Minutes per session: Not on file  . Stress: Not on file  Relationships  . Social connections:    Talks on phone: Not on file    Gets together: Not on file    Attends religious service: Not on file    Active member of club or organization: Not on file    Attends meetings of clubs or organizations: Not on file    Relationship status: Not on file  Other Topics Concern  . Not on file  Social History Narrative  . Not on file     Family History: The patient's ***family history includes Atrial fibrillation in his father; CAD in his maternal grandfather; Congestive Heart Failure in his mother; Heart attack in his maternal grandfather; Hypertension in his brother; Prostate cancer in his paternal grandfather; Rheum arthritis in his father. ROS:   Please see the history of present illness.    All other systems reviewed and are negative.  EKGs/Labs/Other Studies Reviewed:    The following studies were reviewed today:  EKG:  EKG  ordered today.  The ekg ordered today demonstrates ***  Recent Labs: No results found for requested labs within last 8760 hours.  Recent Lipid Panel No results found for: CHOL, TRIG, HDL, CHOLHDL, VLDL, LDLCALC, LDLDIRECT  Physical Exam:    VS:  There were no vitals taken for this visit.    Wt Readings from Last 3 Encounters:  02/10/18 283 lb 12.8 oz (128.7 kg)     GEN: *** Well nourished, well developed in no acute distress HEENT: Normal NECK: No JVD; No carotid bruits LYMPHATICS: No lymphadenopathy CARDIAC: ***RRR, no murmurs, rubs, gallops RESPIRATORY:  Clear to auscultation without rales, wheezing or rhonchi  ABDOMEN: Soft, non-tender, non-distended MUSCULOSKELETAL:  No edema; No deformity  SKIN: Warm and  dry NEUROLOGIC:  Alert and oriented x 3 PSYCHIATRIC:  Normal affect    Signed, Norman Herrlich, MD  03/02/2018 11:56 AM    [facility]

## 2018-03-02 NOTE — Telephone Encounter (Signed)
Patient has appt tomorrow but has not had CT done, no one has called to schedule. Should he reschedule appt for tomorrow?

## 2018-03-02 NOTE — Telephone Encounter (Signed)
Advised patient that we would cancel his appointment to see Dr. Dulce SellarMunley tomorrow. Patient will call to reschedule once cardiac CTA scheduled. Advised patient message would be sent to scheduler for follow-up. Patient verbalized understanding. No further questions.

## 2018-03-03 ENCOUNTER — Telehealth: Payer: Self-pay

## 2018-03-03 ENCOUNTER — Ambulatory Visit: Payer: BLUE CROSS/BLUE SHIELD | Admitting: Cardiology

## 2018-03-03 DIAGNOSIS — R079 Chest pain, unspecified: Secondary | ICD-10-CM

## 2018-03-03 NOTE — Addendum Note (Signed)
Addended by: Ayesha MohairWELLS, Keyundra Fant E on: 03/03/2018 02:26 PM   Modules accepted: Orders

## 2018-03-03 NOTE — Telephone Encounter (Signed)
-----   Message from Baldo DaubBrian J Munley, MD sent at 03/02/2018  4:43 PM EDT ----- Regarding: FW: cardiac cta denied OK set up exercise myoview ----- Message ----- From: Carmelina PaddockWertz, Ashley N Sent: 03/02/2018   3:10 PM To: Baldo DaubBrian J Munley, MD Subject: cardiac cta denied                             Insurance is denying as follows:  Patient does not have uninterpretable EKG and/or contraindications to nuclear test, or inconclusive or equivocal GXT.  Does not meet insurance plan's guidelines.    I can call for an appeal at [phone] if you would like.  Please advise.  Thank you,  Morrie SheldonAshley

## 2018-03-03 NOTE — Telephone Encounter (Signed)
Patient advised that insurance will not cover cardiac CTA at this time. Dr. Dulce SellarMunley advised exercise cardiolite stress test to evaluate chest pain. Patient agreed to stress test. Advised patient would return call with appointment date and time and appointment. Patient verbalized understanding. No further questions.

## 2018-03-03 NOTE — Telephone Encounter (Signed)
Patient advised stress test scheduled for 03/17/18 and 03/18/18 at 12:30 pm both days. Reviewed instruction sheet with patient as well as mailing to home address. Patient is on an insulin pump. Patient will eat breakfast the morning of his appointments. Patient will bring snacks to appointment as well in case he needs them. Patient verbalized understanding. No further questions.

## 2018-03-12 ENCOUNTER — Telehealth (HOSPITAL_COMMUNITY): Payer: Self-pay | Admitting: *Deleted

## 2018-03-12 NOTE — Telephone Encounter (Signed)
Patient given detailed instructions per Myocardial Perfusion Study Information Sheet for the test on 03/17/18 Patient notified to arrive 15 minutes early and that it is imperative to arrive on time for appointment to keep from having the test rescheduled.  If you need to cancel or reschedule your appointment, please call the office within 24 hours of your appointment. . Patient verbalized understanding. Maurya Nethery Jacqueline    

## 2018-03-17 ENCOUNTER — Ambulatory Visit (HOSPITAL_COMMUNITY): Payer: BLUE CROSS/BLUE SHIELD | Attending: Cardiovascular Disease

## 2018-03-17 DIAGNOSIS — R079 Chest pain, unspecified: Secondary | ICD-10-CM | POA: Insufficient documentation

## 2018-03-17 DIAGNOSIS — I10 Essential (primary) hypertension: Secondary | ICD-10-CM | POA: Insufficient documentation

## 2018-03-17 DIAGNOSIS — E119 Type 2 diabetes mellitus without complications: Secondary | ICD-10-CM | POA: Insufficient documentation

## 2018-03-17 DIAGNOSIS — E785 Hyperlipidemia, unspecified: Secondary | ICD-10-CM | POA: Diagnosis not present

## 2018-03-17 IMAGING — NM NM MISC PROCEDURE
5 series · 30 of 30 positions shown · non-contrast
Comparison: none

[Series 1: stress - gated · 6.51mm/px · 6 of 512 frames shown]
[frame 43/512]
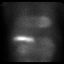
[frame 128/512]
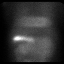
[frame 214/512]
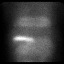
[frame 299/512]
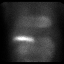
[frame 384/512]
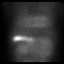
[frame 470/512]
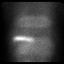

[Series 1: wbr_s-proj_st stress - gated · 6.51mm/px · 6 of 512 frames shown]
[frame 43/512]
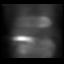
[frame 128/512]
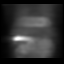
[frame 214/512]
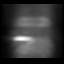
[frame 299/512]
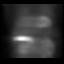
[frame 384/512]
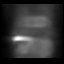
[frame 470/512]
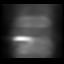

[Series 2: stress - perfusion · 6.51mm/px · 6 of 64 frames shown]
[frame 6/64]
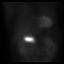
[frame 16/64]
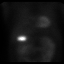
[frame 27/64]
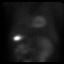
[frame 38/64]
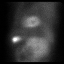
[frame 48/64]
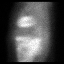
[frame 59/64]
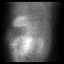

[Series 3: rest · 6.51mm/px · 6 of 64 frames shown]
[frame 6/64]
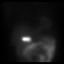
[frame 16/64]
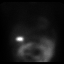
[frame 27/64]
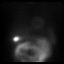
[frame 38/64]
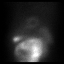
[frame 48/64]
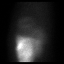
[frame 59/64]
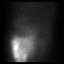

[Series 3: wbr_r-proj_st rest · 6.51mm/px · 6 of 64 frames shown]
[frame 6/64]
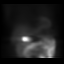
[frame 16/64]
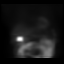
[frame 27/64]
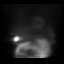
[frame 38/64]
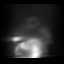
[frame 48/64]
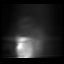
[frame 59/64]
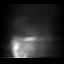

[30 of 30 positions shown; findings below may reference images not displayed]

Canned report from images found in remote index.

Refer to host system for actual result text.

## 2018-03-17 MED ORDER — TECHNETIUM TC 99M TETROFOSMIN IV KIT
31.5000 | PACK | Freq: Once | INTRAVENOUS | Status: AC | PRN
  Administered 2018-03-17: 31.5 via INTRAVENOUS
  Filled 2018-03-17: qty 32

## 2018-03-18 ENCOUNTER — Ambulatory Visit (HOSPITAL_COMMUNITY): Payer: BLUE CROSS/BLUE SHIELD

## 2018-03-18 ENCOUNTER — Ambulatory Visit (HOSPITAL_COMMUNITY): Payer: BLUE CROSS/BLUE SHIELD | Attending: Cardiology

## 2018-03-18 LAB — MYOCARDIAL PERFUSION IMAGING
CHL CUP NUCLEAR SSS: 6
CSEPEDS: 0 s
CSEPPHR: 176 {beats}/min
Estimated workload: 10.1 METS
Exercise duration (min): 9 min
LV sys vol: 62 mL
LVDIAVOL: 131 mL (ref 62–150)
MPHR: 183 {beats}/min
NUC STRESS TID: 0.81
Percent HR: 96 %
RATE: 0.39
Rest HR: 101 {beats}/min
SDS: 3
SRS: 3

## 2018-03-18 MED ORDER — TECHNETIUM TC 99M TETROFOSMIN IV KIT
31.2000 | PACK | Freq: Once | INTRAVENOUS | Status: AC | PRN
  Administered 2018-03-18: 31.2 via INTRAVENOUS
  Filled 2018-03-18: qty 32

## 2018-03-20 ENCOUNTER — Ambulatory Visit (INDEPENDENT_AMBULATORY_CARE_PROVIDER_SITE_OTHER): Payer: BLUE CROSS/BLUE SHIELD | Admitting: Cardiology

## 2018-03-20 ENCOUNTER — Encounter: Payer: Self-pay | Admitting: Cardiology

## 2018-03-20 VITALS — BP 124/72 | HR 81 | Ht 74.0 in | Wt 279.8 lb

## 2018-03-20 DIAGNOSIS — R079 Chest pain, unspecified: Secondary | ICD-10-CM

## 2018-03-20 DIAGNOSIS — E119 Type 2 diabetes mellitus without complications: Secondary | ICD-10-CM | POA: Diagnosis not present

## 2018-03-20 DIAGNOSIS — I10 Essential (primary) hypertension: Secondary | ICD-10-CM | POA: Diagnosis not present

## 2018-03-20 DIAGNOSIS — Z794 Long term (current) use of insulin: Secondary | ICD-10-CM | POA: Diagnosis not present

## 2018-03-20 DIAGNOSIS — E785 Hyperlipidemia, unspecified: Secondary | ICD-10-CM

## 2018-03-20 NOTE — Patient Instructions (Signed)
Medication Instructions:  Your physician recommends that you continue on your current medications as directed. Please refer to the Current Medication list given to you today.   Labwork: None  Testing/Procedures: None  Follow-Up: Your physician wants you to follow-up in: 2 years. You will receive a reminder letter in the mail two months in advance. If you don't receive a letter, please call our office to schedule the follow-up appointment.   Any Other Special Instructions Will Be Listed Below (If Applicable).     If you need a refill on your cardiac medications before your next appointment, please call your pharmacy.   

## 2018-03-20 NOTE — Progress Notes (Signed)
Cardiology Office Note:    Date:  03/20/2018   ID:  Keith Bautista, DOB 11-Jun-1981, MRN [id number]  PCP:  Assunta Found, MD  Cardiologist:  Norman Herrlich, MD    Referring MD: Assunta Found, MD    ASSESSMENT:    1. Chest pain, unspecified type   2. Essential hypertension   3. Type 2 diabetes mellitus without complication, with long-term current use of insulin (HCC)   4. Hyperlipidemia, unspecified hyperlipidemia type    PLAN:    In order of problems listed above:  1. Resolved he has a very low risk normal myocardial perfusion study at a high exercise workload is a very low risk of obstructive CAD and I would continue to treat him with PPI for suspected gastroesophageal reflux disease.  I asked him to follow-up with me in the office in 2 years as he remains at high risk with his diabetes hypertension hyper lipidemia 2. Stable continue current antihypertensive 3. Stable continue insulin as well as SGLT2 agent 4. Stable continue statin   Next appointment: 2 years   Medication Adjustments/Labs and Tests Ordered: Current medicines are reviewed at length with the patient today.  Concerns regarding medicines are outlined above.  No orders of the defined types were placed in this encounter.  No orders of the defined types were placed in this encounter.   Chief Complaint  Patient presents with  . Follow-up  . Chest Pain    History of Present Illness:    Keith Bautista is a 37 y.o. male with a hx of chest pain, T2 DM hypertension and hyperlipidemia  last seen 02/10/17. Compliance with diet, lifestyle and medications: Yes He is improved chest pain and epigastric distress and belching have resolved with the PPI Past Medical History:  Diagnosis Date  . Chest pain 02/04/2018  . 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 DKA (diabetic ketoacidoses) (HCC) 10/31/2015  . Gastroenteritis, acute 10/31/2015  . Hyperlipidemia 06/18/2013  . Hypertension 06/18/2013  . Tinea  corporis 06/18/2013    Past Surgical History:  Procedure Laterality Date  . KNEE SURGERY Right     Current Medications: Current Meds  Medication Sig  . atorvastatin (LIPITOR) 20 MG tablet Take 20 mg by mouth daily.  . dapagliflozin propanediol (FARXIGA) 10 MG TABS tablet TAKE 1 TABLET ONCE DAILY  . insulin lispro (HUMALOG) 100 UNIT/ML injection Inject into the skin. Insulin pump/sliding scale  . losartan (COZAAR) 100 MG tablet Take 100 mg by mouth daily.  . pantoprazole (PROTONIX) 40 MG tablet Take 40 mg by mouth daily as needed.     Allergies:   Simvastatin   Social History   Socioeconomic History  . Marital status: Married    Spouse name: Not on file  . Number of children: Not on file  . Years of education: Not on file  . Highest education level: Not on file  Occupational History  . Not on file  Social Needs  . Financial resource strain: Not on file  . Food insecurity:    Worry: Not on file    Inability: Not on file  . Transportation needs:    Medical: Not on file    Non-medical: Not on file  Tobacco Use  . Smoking status: Never Smoker  . Smokeless tobacco: Former Neurosurgeon    Types: Chew, Snuff  Substance and Sexual Activity  . Alcohol use: Not Currently  . Drug use: Never  . Sexual activity: Not on file  Lifestyle  .  Physical activity:    Days per week: Not on file    Minutes per session: Not on file  . Stress: Not on file  Relationships  . Social connections:    Talks on phone: Not on file    Gets together: Not on file    Attends religious service: Not on file    Active member of club or organization: Not on file    Attends meetings of clubs or organizations: Not on file    Relationship status: Not on file  Other Topics Concern  . Not on file  Social History Narrative  . Not on file     Family History: The patient's family history includes Atrial fibrillation in his father; CAD in his maternal grandfather; Congestive Heart Failure in his mother; Heart  attack in his maternal grandfather; Hypertension in his brother; Prostate cancer in his paternal grandfather; Rheum arthritis in his father. ROS:   Please see the history of present illness.    All other systems reviewed and are negative.  EKGs/Labs/Other Studies Reviewed:    The following studies were reviewed today:  MPI: Study Highlights   Nuclear stress EF: 52%.  Blood pressure demonstrated a hypertensive response to exercise.  There was no ST segment deviation noted during stress.  There is a small defect of moderate severity present in the apical lateral and apex location. The defect is non-reversible. This is likely related to attenuation artifact vs. Small area of infarct. No ischemia noted.  The left ventricular ejection fraction is mildly decreased (45-54%).  This is a low risk study.  Stress Findings  ECG Baseline ECG exhibits normal sinus rhythm..  Stress Findings The patient exercised following the Bruce protocol.  The patient reported no symptoms during the stress test. The patient experienced no angina during the stress test.   The test was stopped because the patient complained of fatigue.   Heart rate demonstrated a normal response to exercise. Blood pressure demonstrated a hypertensive response to exercise. Overall, the patient's exercise capacity was normal.   Recovery time: 5 minutes. The patient's response to exercise was adequate for diagnosis.  Response to Stress There was no ST segment deviation noted during stress.  Arrhythmias during stress: none.  Arrhythmias during recovery: none.  There were no significant arrhythmias noted during the test.  ECG was interpretable and conclusive.   Estimated workload 10.1 METS       Recent Labs: No results found for requested labs within last 8760 hours.  Recent Lipid Panel No results found for: CHOL, TRIG, HDL, CHOLHDL, VLDL, LDLCALC, LDLDIRECT  Physical Exam:    VS:  BP 124/72 (BP Location: Left Arm,  Patient Position: Sitting, Cuff Size: Large)   Pulse 81   Ht  (1.88 m)   Wt 279 lb 12.8 oz (126.9 kg)   SpO2 98%   BMI 35.92 kg/m     Wt Readings from Last 3 Encounters:  03/20/18 279 lb 12.8 oz (126.9 kg)  03/17/18 283 lb (128.4 kg)  02/10/18 283 lb 12.8 oz (128.7 kg)     GEN:  Well nourished, well developed in no acute distress HEENT: Normal NECK: No JVD; No carotid bruits LYMPHATICS: No lymphadenopathy CARDIAC: RRR, no murmurs, rubs, gallops RESPIRATORY:  Clear to auscultation without rales, wheezing or rhonchi  ABDOMEN: Soft, non-tender, non-distended MUSCULOSKELETAL:  No edema; No deformity  SKIN: Warm and dry NEUROLOGIC:  Alert and oriented x 3 PSYCHIATRIC:  Normal affect    Signed, Norman Herrlich, MD  03/20/2018 12:30 PM    [facility]

## 2018-04-02 DIAGNOSIS — Z0000 Encounter for general adult medical examination without abnormal findings: Secondary | ICD-10-CM | POA: Diagnosis not present

## 2018-04-02 DIAGNOSIS — Z6837 Body mass index (BMI) 37.0-37.9, adult: Secondary | ICD-10-CM | POA: Diagnosis not present

## 2018-04-02 DIAGNOSIS — Z1389 Encounter for screening for other disorder: Secondary | ICD-10-CM | POA: Diagnosis not present

## 2018-04-02 DIAGNOSIS — E119 Type 2 diabetes mellitus without complications: Secondary | ICD-10-CM | POA: Diagnosis not present

## 2018-05-01 DIAGNOSIS — E1165 Type 2 diabetes mellitus with hyperglycemia: Secondary | ICD-10-CM | POA: Diagnosis not present

## 2018-05-15 DIAGNOSIS — E7849 Other hyperlipidemia: Secondary | ICD-10-CM | POA: Diagnosis not present

## 2018-05-15 DIAGNOSIS — I10 Essential (primary) hypertension: Secondary | ICD-10-CM | POA: Diagnosis not present

## 2018-05-15 DIAGNOSIS — E119 Type 2 diabetes mellitus without complications: Secondary | ICD-10-CM | POA: Diagnosis not present

## 2018-05-15 DIAGNOSIS — E668 Other obesity: Secondary | ICD-10-CM | POA: Diagnosis not present

## 2018-05-15 DIAGNOSIS — Z6838 Body mass index (BMI) 38.0-38.9, adult: Secondary | ICD-10-CM | POA: Diagnosis not present

## 2018-05-20 DIAGNOSIS — Z4681 Encounter for fitting and adjustment of insulin pump: Secondary | ICD-10-CM | POA: Diagnosis not present

## 2018-05-20 DIAGNOSIS — E119 Type 2 diabetes mellitus without complications: Secondary | ICD-10-CM | POA: Diagnosis not present

## 2018-05-20 DIAGNOSIS — Z794 Long term (current) use of insulin: Secondary | ICD-10-CM | POA: Diagnosis not present

## 2018-05-20 DIAGNOSIS — I10 Essential (primary) hypertension: Secondary | ICD-10-CM | POA: Diagnosis not present

## 2018-07-20 DIAGNOSIS — E1165 Type 2 diabetes mellitus with hyperglycemia: Secondary | ICD-10-CM | POA: Diagnosis not present

## 2018-07-30 DIAGNOSIS — E119 Type 2 diabetes mellitus without complications: Secondary | ICD-10-CM | POA: Diagnosis not present

## 2018-08-20 DIAGNOSIS — I10 Essential (primary) hypertension: Secondary | ICD-10-CM | POA: Diagnosis not present

## 2018-08-20 DIAGNOSIS — Z23 Encounter for immunization: Secondary | ICD-10-CM | POA: Diagnosis not present

## 2018-08-20 DIAGNOSIS — E119 Type 2 diabetes mellitus without complications: Secondary | ICD-10-CM | POA: Diagnosis not present

## 2018-08-20 DIAGNOSIS — Z4681 Encounter for fitting and adjustment of insulin pump: Secondary | ICD-10-CM | POA: Diagnosis not present

## 2018-08-20 DIAGNOSIS — Z794 Long term (current) use of insulin: Secondary | ICD-10-CM | POA: Diagnosis not present

## 2018-09-29 DIAGNOSIS — E7849 Other hyperlipidemia: Secondary | ICD-10-CM | POA: Diagnosis not present

## 2018-09-29 DIAGNOSIS — E1165 Type 2 diabetes mellitus with hyperglycemia: Secondary | ICD-10-CM | POA: Diagnosis not present

## 2018-09-29 DIAGNOSIS — I10 Essential (primary) hypertension: Secondary | ICD-10-CM | POA: Diagnosis not present

## 2018-09-29 DIAGNOSIS — Z794 Long term (current) use of insulin: Secondary | ICD-10-CM | POA: Diagnosis not present

## 2018-09-30 DIAGNOSIS — J209 Acute bronchitis, unspecified: Secondary | ICD-10-CM | POA: Diagnosis not present

## 2018-10-13 DIAGNOSIS — E1165 Type 2 diabetes mellitus with hyperglycemia: Secondary | ICD-10-CM | POA: Diagnosis not present

## 2018-10-29 DIAGNOSIS — E119 Type 2 diabetes mellitus without complications: Secondary | ICD-10-CM | POA: Diagnosis not present

## 2018-12-21 DIAGNOSIS — J1189 Influenza due to unidentified influenza virus with other manifestations: Secondary | ICD-10-CM | POA: Diagnosis not present

## 2019-01-11 DIAGNOSIS — E1165 Type 2 diabetes mellitus with hyperglycemia: Secondary | ICD-10-CM | POA: Diagnosis not present

## 2019-02-01 DIAGNOSIS — E109 Type 1 diabetes mellitus without complications: Secondary | ICD-10-CM | POA: Diagnosis not present

## 2019-02-01 DIAGNOSIS — Z794 Long term (current) use of insulin: Secondary | ICD-10-CM | POA: Diagnosis not present

## 2019-04-06 DIAGNOSIS — L0889 Other specified local infections of the skin and subcutaneous tissue: Secondary | ICD-10-CM | POA: Diagnosis not present

## 2019-04-06 DIAGNOSIS — E119 Type 2 diabetes mellitus without complications: Secondary | ICD-10-CM | POA: Diagnosis not present

## 2019-04-06 DIAGNOSIS — W57XXXA Bitten or stung by nonvenomous insect and other nonvenomous arthropods, initial encounter: Secondary | ICD-10-CM | POA: Diagnosis not present

## 2019-04-13 DIAGNOSIS — E1165 Type 2 diabetes mellitus with hyperglycemia: Secondary | ICD-10-CM | POA: Diagnosis not present

## 2019-05-26 DIAGNOSIS — Z794 Long term (current) use of insulin: Secondary | ICD-10-CM | POA: Diagnosis not present

## 2019-05-26 DIAGNOSIS — Z4681 Encounter for fitting and adjustment of insulin pump: Secondary | ICD-10-CM | POA: Diagnosis not present

## 2019-05-26 DIAGNOSIS — I10 Essential (primary) hypertension: Secondary | ICD-10-CM | POA: Diagnosis not present

## 2019-05-26 DIAGNOSIS — E119 Type 2 diabetes mellitus without complications: Secondary | ICD-10-CM | POA: Diagnosis not present

## 2019-06-07 DIAGNOSIS — E119 Type 2 diabetes mellitus without complications: Secondary | ICD-10-CM | POA: Diagnosis not present

## 2019-06-07 DIAGNOSIS — I10 Essential (primary) hypertension: Secondary | ICD-10-CM | POA: Diagnosis not present

## 2019-06-07 DIAGNOSIS — E785 Hyperlipidemia, unspecified: Secondary | ICD-10-CM | POA: Diagnosis not present

## 2019-06-07 DIAGNOSIS — Z794 Long term (current) use of insulin: Secondary | ICD-10-CM | POA: Diagnosis not present

## 2019-08-04 DIAGNOSIS — E109 Type 1 diabetes mellitus without complications: Secondary | ICD-10-CM | POA: Diagnosis not present

## 2019-08-04 DIAGNOSIS — Z794 Long term (current) use of insulin: Secondary | ICD-10-CM | POA: Diagnosis not present

## 2019-08-07 DIAGNOSIS — Z23 Encounter for immunization: Secondary | ICD-10-CM | POA: Diagnosis not present

## 2019-08-11 DIAGNOSIS — Z4681 Encounter for fitting and adjustment of insulin pump: Secondary | ICD-10-CM | POA: Diagnosis not present

## 2019-08-11 DIAGNOSIS — I10 Essential (primary) hypertension: Secondary | ICD-10-CM | POA: Diagnosis not present

## 2019-08-11 DIAGNOSIS — E119 Type 2 diabetes mellitus without complications: Secondary | ICD-10-CM | POA: Diagnosis not present

## 2019-08-11 DIAGNOSIS — Z794 Long term (current) use of insulin: Secondary | ICD-10-CM | POA: Diagnosis not present

## 2019-10-06 DIAGNOSIS — E785 Hyperlipidemia, unspecified: Secondary | ICD-10-CM | POA: Diagnosis not present

## 2019-10-06 DIAGNOSIS — Z794 Long term (current) use of insulin: Secondary | ICD-10-CM | POA: Diagnosis not present

## 2019-10-06 DIAGNOSIS — I10 Essential (primary) hypertension: Secondary | ICD-10-CM | POA: Diagnosis not present

## 2019-10-06 DIAGNOSIS — E119 Type 2 diabetes mellitus without complications: Secondary | ICD-10-CM | POA: Diagnosis not present

## 2019-10-06 DIAGNOSIS — Z1331 Encounter for screening for depression: Secondary | ICD-10-CM | POA: Diagnosis not present

## 2019-10-25 DIAGNOSIS — R82998 Other abnormal findings in urine: Secondary | ICD-10-CM | POA: Diagnosis not present

## 2019-10-25 DIAGNOSIS — E7849 Other hyperlipidemia: Secondary | ICD-10-CM | POA: Diagnosis not present

## 2019-10-25 DIAGNOSIS — I10 Essential (primary) hypertension: Secondary | ICD-10-CM | POA: Diagnosis not present

## 2019-11-03 DIAGNOSIS — E109 Type 1 diabetes mellitus without complications: Secondary | ICD-10-CM | POA: Diagnosis not present

## 2019-11-03 DIAGNOSIS — Z794 Long term (current) use of insulin: Secondary | ICD-10-CM | POA: Diagnosis not present

## 2019-11-16 DIAGNOSIS — Z4681 Encounter for fitting and adjustment of insulin pump: Secondary | ICD-10-CM | POA: Diagnosis not present

## 2019-11-16 DIAGNOSIS — Z794 Long term (current) use of insulin: Secondary | ICD-10-CM | POA: Diagnosis not present

## 2019-11-16 DIAGNOSIS — I10 Essential (primary) hypertension: Secondary | ICD-10-CM | POA: Diagnosis not present

## 2019-11-16 DIAGNOSIS — E119 Type 2 diabetes mellitus without complications: Secondary | ICD-10-CM | POA: Diagnosis not present

## 2020-01-17 DIAGNOSIS — E109 Type 1 diabetes mellitus without complications: Secondary | ICD-10-CM | POA: Diagnosis not present

## 2020-01-20 DIAGNOSIS — E785 Hyperlipidemia, unspecified: Secondary | ICD-10-CM | POA: Diagnosis not present

## 2020-01-20 DIAGNOSIS — I10 Essential (primary) hypertension: Secondary | ICD-10-CM | POA: Diagnosis not present

## 2020-01-20 DIAGNOSIS — E119 Type 2 diabetes mellitus without complications: Secondary | ICD-10-CM | POA: Diagnosis not present

## 2020-02-08 DIAGNOSIS — E109 Type 1 diabetes mellitus without complications: Secondary | ICD-10-CM | POA: Diagnosis not present

## 2020-02-08 DIAGNOSIS — Z794 Long term (current) use of insulin: Secondary | ICD-10-CM | POA: Diagnosis not present

## 2020-02-17 DIAGNOSIS — Z4681 Encounter for fitting and adjustment of insulin pump: Secondary | ICD-10-CM | POA: Diagnosis not present

## 2020-02-17 DIAGNOSIS — I10 Essential (primary) hypertension: Secondary | ICD-10-CM | POA: Diagnosis not present

## 2020-02-17 DIAGNOSIS — Z794 Long term (current) use of insulin: Secondary | ICD-10-CM | POA: Diagnosis not present

## 2020-02-17 DIAGNOSIS — E1165 Type 2 diabetes mellitus with hyperglycemia: Secondary | ICD-10-CM | POA: Diagnosis not present

## 2020-05-11 DIAGNOSIS — Z794 Long term (current) use of insulin: Secondary | ICD-10-CM | POA: Diagnosis not present

## 2020-05-11 DIAGNOSIS — E109 Type 1 diabetes mellitus without complications: Secondary | ICD-10-CM | POA: Diagnosis not present

## 2020-05-19 DIAGNOSIS — Z794 Long term (current) use of insulin: Secondary | ICD-10-CM | POA: Diagnosis not present

## 2020-05-19 DIAGNOSIS — E119 Type 2 diabetes mellitus without complications: Secondary | ICD-10-CM | POA: Diagnosis not present

## 2020-05-19 DIAGNOSIS — I429 Cardiomyopathy, unspecified: Secondary | ICD-10-CM | POA: Diagnosis not present

## 2020-05-19 DIAGNOSIS — I10 Essential (primary) hypertension: Secondary | ICD-10-CM | POA: Diagnosis not present

## 2020-07-27 DIAGNOSIS — I10 Essential (primary) hypertension: Secondary | ICD-10-CM | POA: Diagnosis not present

## 2020-07-27 DIAGNOSIS — E1165 Type 2 diabetes mellitus with hyperglycemia: Secondary | ICD-10-CM | POA: Diagnosis not present

## 2020-08-12 DIAGNOSIS — Z794 Long term (current) use of insulin: Secondary | ICD-10-CM | POA: Diagnosis not present

## 2020-08-12 DIAGNOSIS — E109 Type 1 diabetes mellitus without complications: Secondary | ICD-10-CM | POA: Diagnosis not present

## 2020-09-16 DIAGNOSIS — Z23 Encounter for immunization: Secondary | ICD-10-CM | POA: Diagnosis not present

## 2020-09-28 DIAGNOSIS — E108 Type 1 diabetes mellitus with unspecified complications: Secondary | ICD-10-CM | POA: Diagnosis not present

## 2020-09-28 DIAGNOSIS — Z0000 Encounter for general adult medical examination without abnormal findings: Secondary | ICD-10-CM | POA: Diagnosis not present

## 2020-09-28 DIAGNOSIS — E785 Hyperlipidemia, unspecified: Secondary | ICD-10-CM | POA: Diagnosis not present

## 2020-09-28 DIAGNOSIS — Z125 Encounter for screening for malignant neoplasm of prostate: Secondary | ICD-10-CM | POA: Diagnosis not present

## 2020-10-05 DIAGNOSIS — R5382 Chronic fatigue, unspecified: Secondary | ICD-10-CM | POA: Diagnosis not present

## 2020-10-05 DIAGNOSIS — G5710 Meralgia paresthetica, unspecified lower limb: Secondary | ICD-10-CM | POA: Diagnosis not present

## 2020-10-05 DIAGNOSIS — R8290 Unspecified abnormal findings in urine: Secondary | ICD-10-CM | POA: Diagnosis not present

## 2020-10-05 DIAGNOSIS — E1165 Type 2 diabetes mellitus with hyperglycemia: Secondary | ICD-10-CM | POA: Diagnosis not present

## 2020-10-05 DIAGNOSIS — Z0000 Encounter for general adult medical examination without abnormal findings: Secondary | ICD-10-CM | POA: Diagnosis not present

## 2020-10-05 DIAGNOSIS — E669 Obesity, unspecified: Secondary | ICD-10-CM | POA: Diagnosis not present

## 2020-10-05 DIAGNOSIS — I10 Essential (primary) hypertension: Secondary | ICD-10-CM | POA: Diagnosis not present

## 2020-10-05 DIAGNOSIS — R82998 Other abnormal findings in urine: Secondary | ICD-10-CM | POA: Diagnosis not present

## 2020-10-06 ENCOUNTER — Other Ambulatory Visit: Payer: Self-pay | Admitting: Endocrinology

## 2020-10-06 DIAGNOSIS — Z0000 Encounter for general adult medical examination without abnormal findings: Secondary | ICD-10-CM

## 2020-10-25 DIAGNOSIS — Z20822 Contact with and (suspected) exposure to covid-19: Secondary | ICD-10-CM | POA: Diagnosis not present

## 2020-10-26 DIAGNOSIS — E1165 Type 2 diabetes mellitus with hyperglycemia: Secondary | ICD-10-CM | POA: Diagnosis not present

## 2020-10-29 DIAGNOSIS — Z20822 Contact with and (suspected) exposure to covid-19: Secondary | ICD-10-CM | POA: Diagnosis not present

## 2020-11-01 ENCOUNTER — Other Ambulatory Visit: Payer: Self-pay | Admitting: Endocrinology

## 2020-11-01 DIAGNOSIS — Z0001 Encounter for general adult medical examination with abnormal findings: Secondary | ICD-10-CM

## 2020-11-03 ENCOUNTER — Other Ambulatory Visit: Payer: BC Managed Care – PPO

## 2020-11-09 DIAGNOSIS — Z794 Long term (current) use of insulin: Secondary | ICD-10-CM | POA: Diagnosis not present

## 2020-11-09 DIAGNOSIS — E109 Type 1 diabetes mellitus without complications: Secondary | ICD-10-CM | POA: Diagnosis not present

## 2020-11-24 ENCOUNTER — Ambulatory Visit
Admission: RE | Admit: 2020-11-24 | Discharge: 2020-11-24 | Disposition: A | Discharge status: Home / Self Care | Payer: No Typology Code available for payment source | Source: Ambulatory Visit | Attending: Endocrinology | Admitting: Endocrinology

## 2020-11-24 ENCOUNTER — Other Ambulatory Visit: Payer: BC Managed Care – PPO

## 2020-11-24 DIAGNOSIS — E7800 Pure hypercholesterolemia, unspecified: Secondary | ICD-10-CM | POA: Diagnosis not present

## 2020-11-24 DIAGNOSIS — Z0001 Encounter for general adult medical examination with abnormal findings: Secondary | ICD-10-CM

## 2020-11-24 IMAGING — CT CT CARDIAC CORONARY ARTERY CALCIUM SCORE
3 series · 14 of 20 positions shown, 16 images · non-contrast
Comparison: None.

CLINICAL DATA: 39-year-old white male with elevated cholesterol.

EXAM:
CT CARDIAC CORONARY ARTERY CALCIUM SCORE
TECHNIQUE: Non-contrast imaging through the heart was performed using
prospective ECG gating. Image post processing was performed on an
independent workstation, allowing for quantitative analysis of the
heart and coronary arteries. Note that this exam targets the heart
and the chest was not imaged in its entirety.

[Series 2: calcium scoring 2.00 qr36 bestdiast 69% hrt calciu · axial · 0.47mm/px · z∈[+1726,+1810]mm · 4 of 70 slices shown]
[im 14/70  vessel]
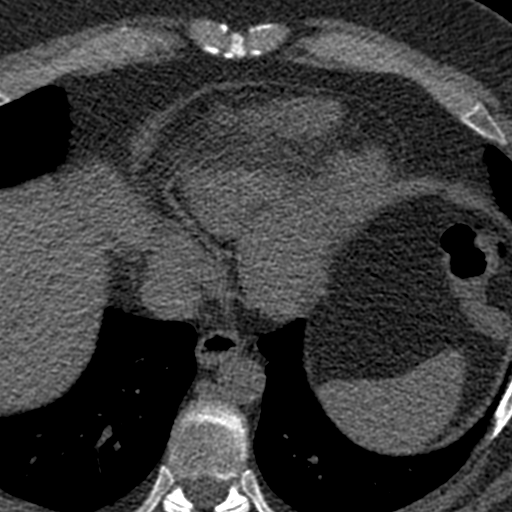
[im 28/70  vessel]
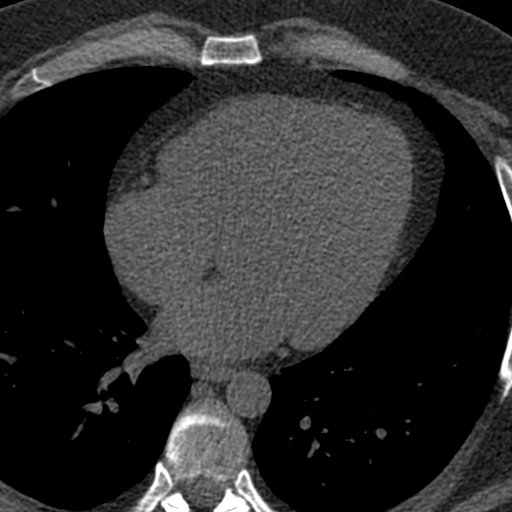
[im 42/70  vessel]
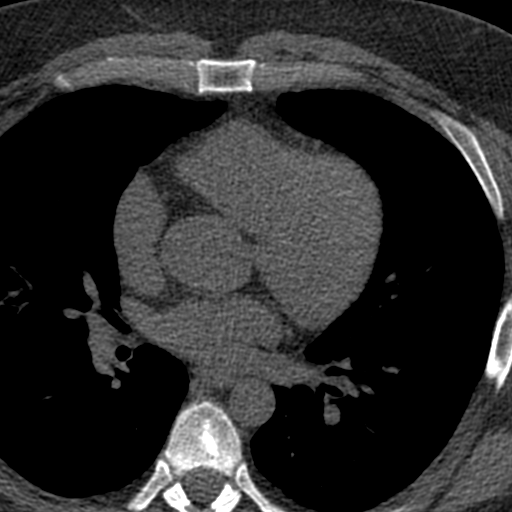
[im 56/70  vessel]
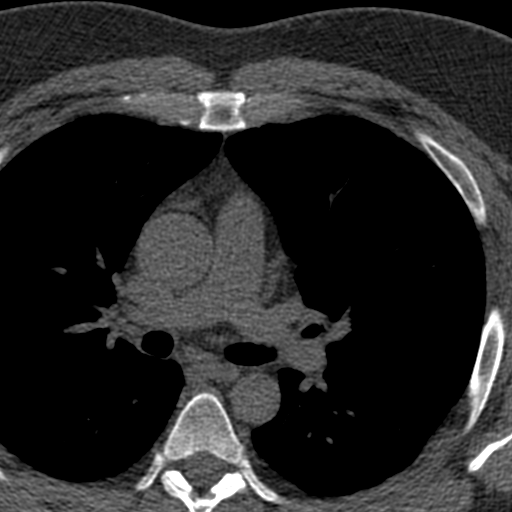

[Series 3: calcium scoring 2.00 br40 bestdiast 69% axial · axial · 0.71mm/px · z∈[+1722,+1814]mm · 5 of 70 slices shown, 7 images]
[im 12/70  vessel]
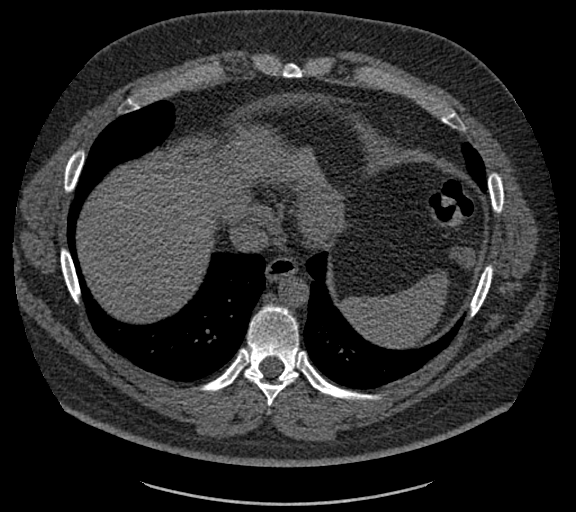
[im 12/70  lung]
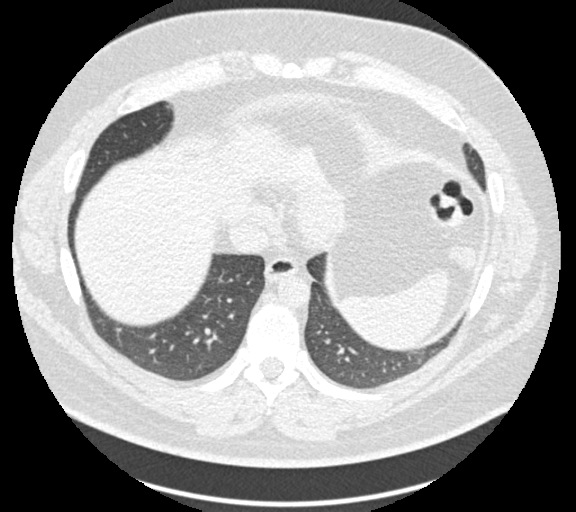
[im 24/70  vessel]
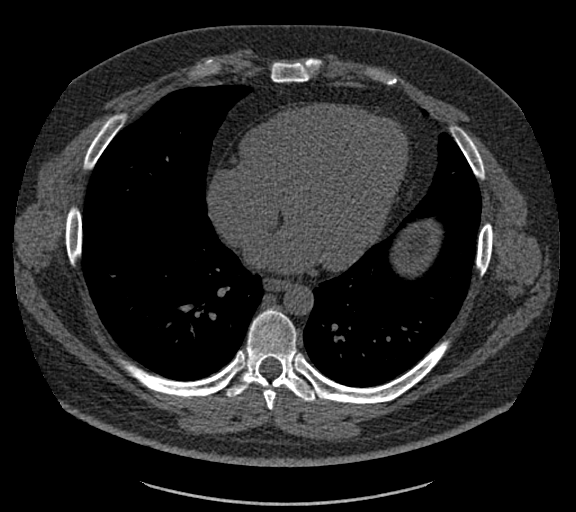
[im 35/70  vessel]
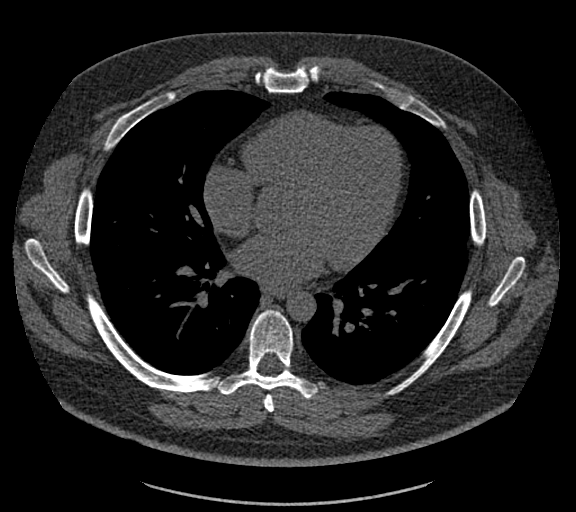
[im 47/70  vessel]
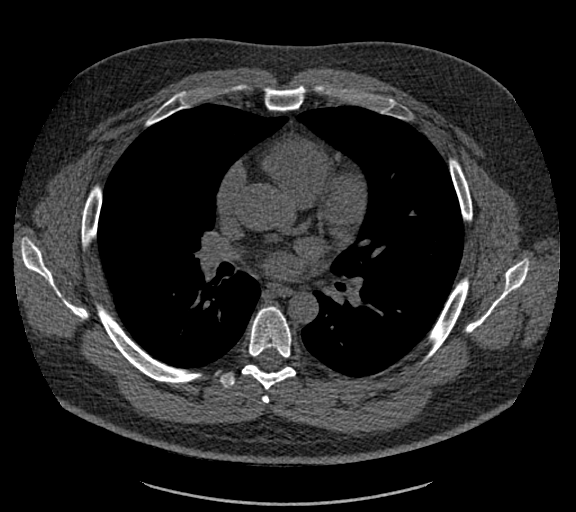
[im 58/70  vessel]
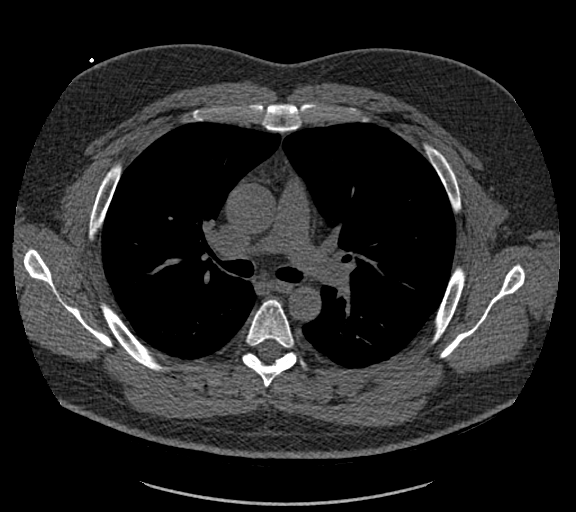
[im 58/70  lung]
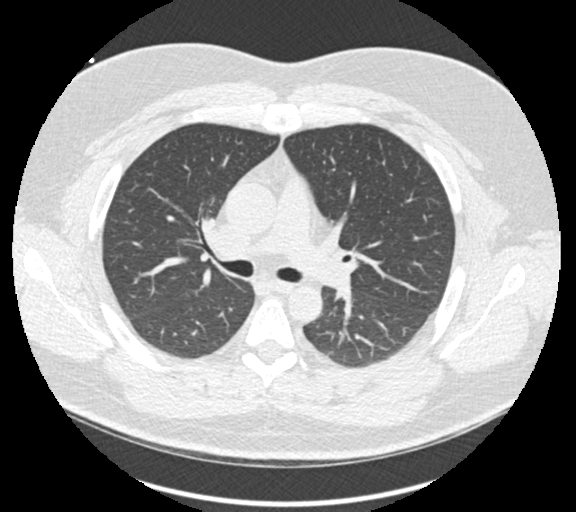

[Series 9: calcium scoring 2.00 br60 bestdiast 69% lungs · axial · 0.71mm/px · z∈[+1722,+1814]mm · 5 of 70 slices shown]
[im 12/70  vessel]
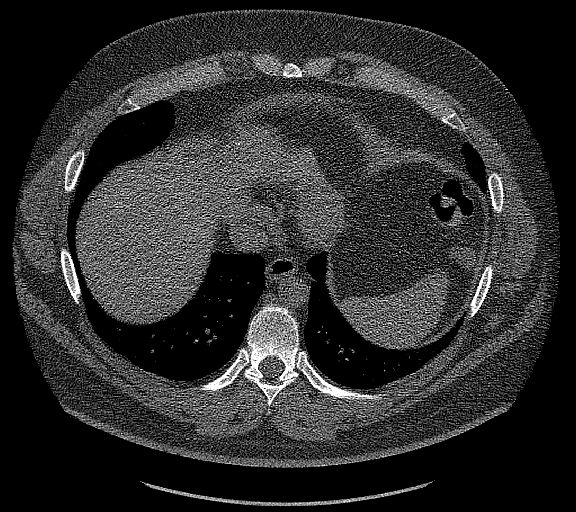
[im 24/70  vessel]
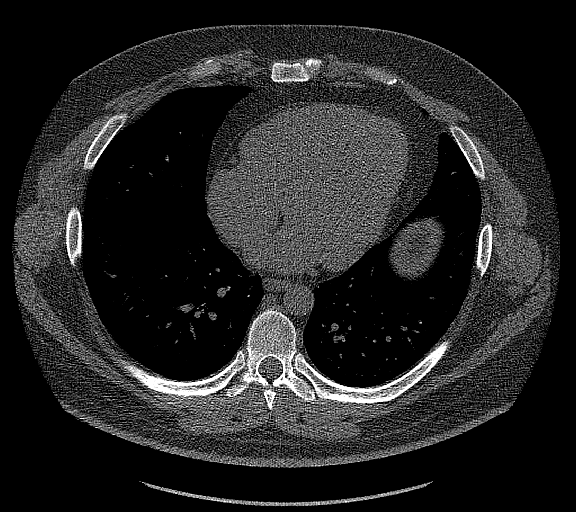
[im 35/70  vessel]
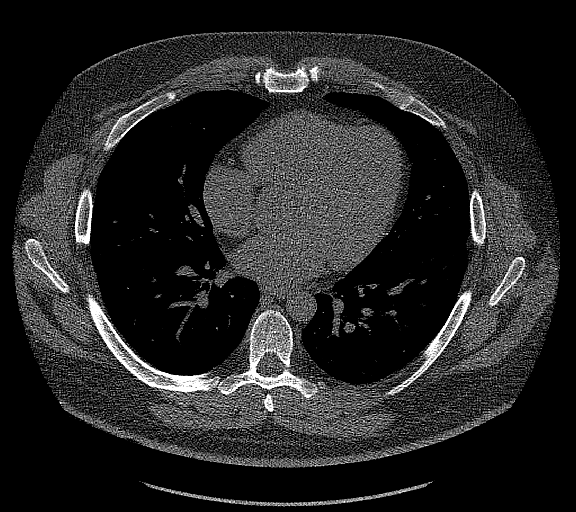
[im 47/70  vessel]
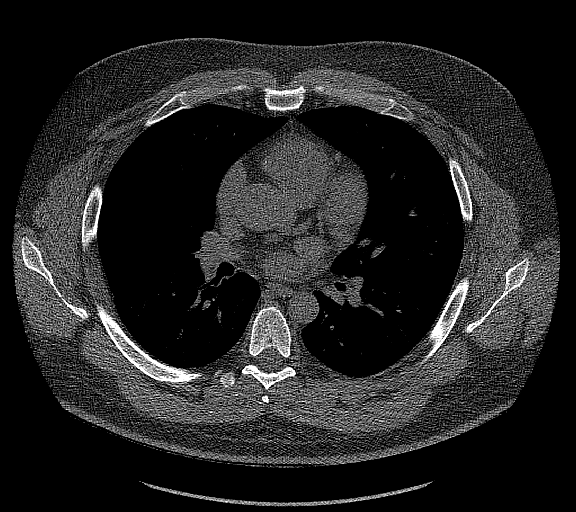
[im 58/70  vessel]
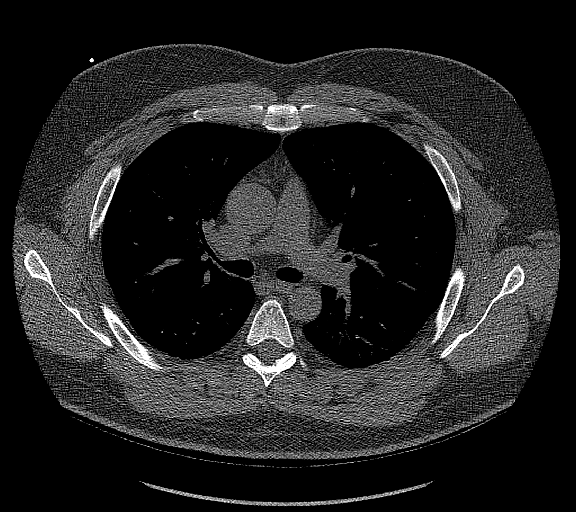

[14 of 20 positions shown; findings below may reference images not displayed]

FINDINGS: CORONARY CALCIUM SCORES:

Left Main: 0

LAD: 0

LCx: 0

RCA: 0

Total Agatston Score: 0

[HOSPITAL] percentile: 0

AORTA MEASUREMENTS:

Ascending Aorta: 36 mm

Descending Aorta: 26 mm

OTHER FINDINGS:

No coronary artery calcium. Heart size is normal. Minimal
pericardial fluid. Images of upper abdomen are unremarkable. Trachea
and mainstem bronchi are patent. Visualized lungs are clear. No
large pleural effusions. Small blebs along the posterior aspect of
the right lower lobe.
IMPRESSION: No coronary artery calcium.  Coronary calcium score is 0.

## 2020-11-28 DIAGNOSIS — Z20822 Contact with and (suspected) exposure to covid-19: Secondary | ICD-10-CM | POA: Diagnosis not present

## 2020-12-08 ENCOUNTER — Other Ambulatory Visit: Payer: BC Managed Care – PPO

## 2021-01-17 DIAGNOSIS — I10 Essential (primary) hypertension: Secondary | ICD-10-CM | POA: Diagnosis not present

## 2021-01-17 DIAGNOSIS — E1165 Type 2 diabetes mellitus with hyperglycemia: Secondary | ICD-10-CM | POA: Diagnosis not present

## 2021-02-07 DIAGNOSIS — E109 Type 1 diabetes mellitus without complications: Secondary | ICD-10-CM | POA: Diagnosis not present

## 2021-02-07 DIAGNOSIS — Z794 Long term (current) use of insulin: Secondary | ICD-10-CM | POA: Diagnosis not present

## 2021-02-08 DIAGNOSIS — E109 Type 1 diabetes mellitus without complications: Secondary | ICD-10-CM | POA: Diagnosis not present

## 2021-02-08 DIAGNOSIS — H5213 Myopia, bilateral: Secondary | ICD-10-CM | POA: Diagnosis not present

## 2021-02-08 DIAGNOSIS — H52223 Regular astigmatism, bilateral: Secondary | ICD-10-CM | POA: Diagnosis not present

## 2021-05-08 DIAGNOSIS — Z794 Long term (current) use of insulin: Secondary | ICD-10-CM | POA: Diagnosis not present

## 2021-05-08 DIAGNOSIS — E109 Type 1 diabetes mellitus without complications: Secondary | ICD-10-CM | POA: Diagnosis not present

## 2021-06-28 DIAGNOSIS — E1165 Type 2 diabetes mellitus with hyperglycemia: Secondary | ICD-10-CM | POA: Diagnosis not present

## 2021-07-16 DIAGNOSIS — U071 COVID-19: Secondary | ICD-10-CM | POA: Diagnosis not present

## 2021-07-16 DIAGNOSIS — Z23 Encounter for immunization: Secondary | ICD-10-CM | POA: Diagnosis not present

## 2021-09-11 DIAGNOSIS — E785 Hyperlipidemia, unspecified: Secondary | ICD-10-CM | POA: Diagnosis not present

## 2021-09-11 DIAGNOSIS — E1165 Type 2 diabetes mellitus with hyperglycemia: Secondary | ICD-10-CM | POA: Diagnosis not present

## 2021-09-11 DIAGNOSIS — Z0000 Encounter for general adult medical examination without abnormal findings: Secondary | ICD-10-CM | POA: Diagnosis not present

## 2021-09-11 DIAGNOSIS — Z125 Encounter for screening for malignant neoplasm of prostate: Secondary | ICD-10-CM | POA: Diagnosis not present

## 2021-10-30 DIAGNOSIS — E1165 Type 2 diabetes mellitus with hyperglycemia: Secondary | ICD-10-CM | POA: Diagnosis not present

## 2021-10-30 DIAGNOSIS — I10 Essential (primary) hypertension: Secondary | ICD-10-CM | POA: Diagnosis not present

## 2022-01-14 DIAGNOSIS — E1165 Type 2 diabetes mellitus with hyperglycemia: Secondary | ICD-10-CM | POA: Diagnosis not present

## 2022-02-13 DIAGNOSIS — E109 Type 1 diabetes mellitus without complications: Secondary | ICD-10-CM | POA: Diagnosis not present

## 2022-02-13 DIAGNOSIS — H1789 Other corneal scars and opacities: Secondary | ICD-10-CM | POA: Diagnosis not present

## 2022-05-31 DIAGNOSIS — E785 Hyperlipidemia, unspecified: Secondary | ICD-10-CM | POA: Diagnosis not present

## 2022-05-31 DIAGNOSIS — E1165 Type 2 diabetes mellitus with hyperglycemia: Secondary | ICD-10-CM | POA: Diagnosis not present

## 2022-05-31 DIAGNOSIS — R42 Dizziness and giddiness: Secondary | ICD-10-CM | POA: Diagnosis not present

## 2022-07-11 DIAGNOSIS — E139 Other specified diabetes mellitus without complications: Secondary | ICD-10-CM | POA: Diagnosis not present

## 2022-07-11 DIAGNOSIS — I10 Essential (primary) hypertension: Secondary | ICD-10-CM | POA: Diagnosis not present

## 2022-10-01 DIAGNOSIS — Z1211 Encounter for screening for malignant neoplasm of colon: Secondary | ICD-10-CM | POA: Diagnosis not present

## 2022-10-01 DIAGNOSIS — E785 Hyperlipidemia, unspecified: Secondary | ICD-10-CM | POA: Diagnosis not present

## 2022-10-01 DIAGNOSIS — E1165 Type 2 diabetes mellitus with hyperglycemia: Secondary | ICD-10-CM | POA: Diagnosis not present

## 2022-10-01 DIAGNOSIS — Z125 Encounter for screening for malignant neoplasm of prostate: Secondary | ICD-10-CM | POA: Diagnosis not present

## 2022-10-08 DIAGNOSIS — Z23 Encounter for immunization: Secondary | ICD-10-CM | POA: Diagnosis not present

## 2022-10-08 DIAGNOSIS — R82998 Other abnormal findings in urine: Secondary | ICD-10-CM | POA: Diagnosis not present

## 2022-10-08 DIAGNOSIS — Z1331 Encounter for screening for depression: Secondary | ICD-10-CM | POA: Diagnosis not present

## 2022-10-08 DIAGNOSIS — Z1339 Encounter for screening examination for other mental health and behavioral disorders: Secondary | ICD-10-CM | POA: Diagnosis not present

## 2022-10-08 DIAGNOSIS — I10 Essential (primary) hypertension: Secondary | ICD-10-CM | POA: Diagnosis not present

## 2022-10-08 DIAGNOSIS — Z0000 Encounter for general adult medical examination without abnormal findings: Secondary | ICD-10-CM | POA: Diagnosis not present

## 2022-10-08 DIAGNOSIS — E1165 Type 2 diabetes mellitus with hyperglycemia: Secondary | ICD-10-CM | POA: Diagnosis not present

## 2022-11-13 ENCOUNTER — Other Ambulatory Visit (HOSPITAL_BASED_OUTPATIENT_CLINIC_OR_DEPARTMENT_OTHER): Payer: Self-pay

## 2022-11-13 MED ORDER — OZEMPIC (2 MG/DOSE) 8 MG/3ML ~~LOC~~ SOPN
2.0000 mg | PEN_INJECTOR | SUBCUTANEOUS | 3 refills | Status: AC
  Filled 2022-11-13 (×2): qty 3, 28d supply, fill #0

## 2022-12-05 DIAGNOSIS — E785 Hyperlipidemia, unspecified: Secondary | ICD-10-CM | POA: Diagnosis not present

## 2022-12-05 DIAGNOSIS — E1165 Type 2 diabetes mellitus with hyperglycemia: Secondary | ICD-10-CM | POA: Diagnosis not present

## 2023-01-31 DIAGNOSIS — E139 Other specified diabetes mellitus without complications: Secondary | ICD-10-CM | POA: Diagnosis not present

## 2023-01-31 DIAGNOSIS — E1165 Type 2 diabetes mellitus with hyperglycemia: Secondary | ICD-10-CM | POA: Diagnosis not present

## 2023-01-31 DIAGNOSIS — I10 Essential (primary) hypertension: Secondary | ICD-10-CM | POA: Diagnosis not present

## 2023-02-21 DIAGNOSIS — E109 Type 1 diabetes mellitus without complications: Secondary | ICD-10-CM | POA: Diagnosis not present

## 2023-02-21 DIAGNOSIS — H0102B Squamous blepharitis left eye, upper and lower eyelids: Secondary | ICD-10-CM | POA: Diagnosis not present

## 2023-02-21 DIAGNOSIS — B880 Other acariasis: Secondary | ICD-10-CM | POA: Diagnosis not present

## 2023-02-21 DIAGNOSIS — H0102A Squamous blepharitis right eye, upper and lower eyelids: Secondary | ICD-10-CM | POA: Diagnosis not present

## 2023-04-18 DIAGNOSIS — B880 Other acariasis: Secondary | ICD-10-CM | POA: Diagnosis not present

## 2023-04-18 DIAGNOSIS — H0102A Squamous blepharitis right eye, upper and lower eyelids: Secondary | ICD-10-CM | POA: Diagnosis not present

## 2023-04-18 DIAGNOSIS — H18833 Recurrent erosion of cornea, bilateral: Secondary | ICD-10-CM | POA: Diagnosis not present

## 2023-04-18 DIAGNOSIS — H0102B Squamous blepharitis left eye, upper and lower eyelids: Secondary | ICD-10-CM | POA: Diagnosis not present

## 2023-07-15 DIAGNOSIS — E139 Other specified diabetes mellitus without complications: Secondary | ICD-10-CM | POA: Diagnosis not present

## 2023-07-15 DIAGNOSIS — E1165 Type 2 diabetes mellitus with hyperglycemia: Secondary | ICD-10-CM | POA: Diagnosis not present

## 2023-07-15 DIAGNOSIS — I10 Essential (primary) hypertension: Secondary | ICD-10-CM | POA: Diagnosis not present

## 2023-07-22 DIAGNOSIS — D225 Melanocytic nevi of trunk: Secondary | ICD-10-CM | POA: Diagnosis not present

## 2023-07-22 DIAGNOSIS — L814 Other melanin hyperpigmentation: Secondary | ICD-10-CM | POA: Diagnosis not present

## 2023-07-22 DIAGNOSIS — B354 Tinea corporis: Secondary | ICD-10-CM | POA: Diagnosis not present

## 2023-07-22 DIAGNOSIS — D2372 Other benign neoplasm of skin of left lower limb, including hip: Secondary | ICD-10-CM | POA: Diagnosis not present

## 2023-08-21 DIAGNOSIS — Z23 Encounter for immunization: Secondary | ICD-10-CM | POA: Diagnosis not present

## 2023-08-21 DIAGNOSIS — E1165 Type 2 diabetes mellitus with hyperglycemia: Secondary | ICD-10-CM | POA: Diagnosis not present

## 2023-10-07 DIAGNOSIS — Z125 Encounter for screening for malignant neoplasm of prostate: Secondary | ICD-10-CM | POA: Diagnosis not present

## 2023-10-07 DIAGNOSIS — I10 Essential (primary) hypertension: Secondary | ICD-10-CM | POA: Diagnosis not present

## 2023-10-07 DIAGNOSIS — E1165 Type 2 diabetes mellitus with hyperglycemia: Secondary | ICD-10-CM | POA: Diagnosis not present

## 2023-10-07 DIAGNOSIS — E785 Hyperlipidemia, unspecified: Secondary | ICD-10-CM | POA: Diagnosis not present

## 2023-10-14 DIAGNOSIS — E1165 Type 2 diabetes mellitus with hyperglycemia: Secondary | ICD-10-CM | POA: Diagnosis not present

## 2023-10-14 DIAGNOSIS — Z0000 Encounter for general adult medical examination without abnormal findings: Secondary | ICD-10-CM | POA: Diagnosis not present

## 2023-10-14 DIAGNOSIS — Z1212 Encounter for screening for malignant neoplasm of rectum: Secondary | ICD-10-CM | POA: Diagnosis not present

## 2023-10-14 DIAGNOSIS — Z1339 Encounter for screening examination for other mental health and behavioral disorders: Secondary | ICD-10-CM | POA: Diagnosis not present

## 2023-10-14 DIAGNOSIS — Z1331 Encounter for screening for depression: Secondary | ICD-10-CM | POA: Diagnosis not present

## 2023-10-14 DIAGNOSIS — E139 Other specified diabetes mellitus without complications: Secondary | ICD-10-CM | POA: Diagnosis not present

## 2023-10-14 DIAGNOSIS — I10 Essential (primary) hypertension: Secondary | ICD-10-CM | POA: Diagnosis not present

## 2024-01-29 DIAGNOSIS — B351 Tinea unguium: Secondary | ICD-10-CM | POA: Diagnosis not present

## 2024-01-29 DIAGNOSIS — L304 Erythema intertrigo: Secondary | ICD-10-CM | POA: Diagnosis not present

## 2024-02-24 DIAGNOSIS — H18833 Recurrent erosion of cornea, bilateral: Secondary | ICD-10-CM | POA: Diagnosis not present

## 2024-02-24 DIAGNOSIS — H1789 Other corneal scars and opacities: Secondary | ICD-10-CM | POA: Diagnosis not present

## 2024-02-24 DIAGNOSIS — E109 Type 1 diabetes mellitus without complications: Secondary | ICD-10-CM | POA: Diagnosis not present

## 2024-02-24 DIAGNOSIS — B880 Other acariasis: Secondary | ICD-10-CM | POA: Diagnosis not present

## 2024-03-18 DIAGNOSIS — E1165 Type 2 diabetes mellitus with hyperglycemia: Secondary | ICD-10-CM | POA: Diagnosis not present

## 2024-03-18 DIAGNOSIS — I10 Essential (primary) hypertension: Secondary | ICD-10-CM | POA: Diagnosis not present

## 2024-07-13 DIAGNOSIS — E1165 Type 2 diabetes mellitus with hyperglycemia: Secondary | ICD-10-CM | POA: Diagnosis not present

## 2024-07-13 DIAGNOSIS — I10 Essential (primary) hypertension: Secondary | ICD-10-CM | POA: Diagnosis not present

## 2024-11-01 DIAGNOSIS — Z1212 Encounter for screening for malignant neoplasm of rectum: Secondary | ICD-10-CM | POA: Diagnosis not present
